# Patient Record
Sex: Female | Born: 1965 | Race: Black or African American | Hispanic: No | Smoking: Current some day smoker
Health system: Southern US, Community
[De-identification: ages and names within clinical notes are randomized; demographics above are authoritative.]

## PROBLEM LIST (undated history)

## (undated) DIAGNOSIS — Z8619 Personal history of other infectious and parasitic diseases: Secondary | ICD-10-CM

## (undated) DIAGNOSIS — Z8719 Personal history of other diseases of the digestive system: Secondary | ICD-10-CM

## (undated) DIAGNOSIS — B1910 Unspecified viral hepatitis B without hepatic coma: Secondary | ICD-10-CM

## (undated) DIAGNOSIS — D573 Sickle-cell trait: Secondary | ICD-10-CM

## (undated) HISTORY — PX: TUBAL LIGATION: SHX77

## (undated) HISTORY — PX: INCISION AND DRAINAGE INTRA ORAL ABSCESS: SHX1802

## (undated) HISTORY — PX: WISDOM TOOTH EXTRACTION: SHX21

---

## 1998-01-06 ENCOUNTER — Emergency Department (HOSPITAL_COMMUNITY): Admission: EM | Admit: 1998-01-06 | Discharge: 1998-01-06 | Payer: Self-pay | Admitting: Emergency Medicine

## 1998-01-10 ENCOUNTER — Emergency Department (HOSPITAL_COMMUNITY): Admission: EM | Admit: 1998-01-10 | Discharge: 1998-01-10 | Payer: Self-pay | Admitting: Emergency Medicine

## 1999-09-20 ENCOUNTER — Emergency Department (HOSPITAL_COMMUNITY): Admission: EM | Admit: 1999-09-20 | Discharge: 1999-09-20 | Payer: Self-pay

## 1999-12-17 ENCOUNTER — Emergency Department (HOSPITAL_COMMUNITY): Admission: EM | Admit: 1999-12-17 | Discharge: 1999-12-17 | Payer: Self-pay | Admitting: *Deleted

## 2000-01-21 ENCOUNTER — Encounter: Payer: Self-pay | Admitting: Emergency Medicine

## 2000-01-21 ENCOUNTER — Emergency Department (HOSPITAL_COMMUNITY): Admission: EM | Admit: 2000-01-21 | Discharge: 2000-01-21 | Payer: Self-pay | Admitting: Emergency Medicine

## 2001-03-21 ENCOUNTER — Emergency Department (HOSPITAL_COMMUNITY): Admission: EM | Admit: 2001-03-21 | Discharge: 2001-03-22 | Payer: Self-pay | Admitting: Emergency Medicine

## 2001-03-21 ENCOUNTER — Encounter: Payer: Self-pay | Admitting: Emergency Medicine

## 2001-10-12 ENCOUNTER — Emergency Department (HOSPITAL_COMMUNITY): Admission: EM | Admit: 2001-10-12 | Discharge: 2001-10-12 | Payer: Self-pay | Admitting: Emergency Medicine

## 2001-10-14 ENCOUNTER — Emergency Department (HOSPITAL_COMMUNITY): Admission: EM | Admit: 2001-10-14 | Discharge: 2001-10-14 | Payer: Self-pay | Admitting: Emergency Medicine

## 2002-03-26 ENCOUNTER — Inpatient Hospital Stay (HOSPITAL_COMMUNITY): Admission: EM | Admit: 2002-03-26 | Discharge: 2002-03-29 | Payer: Self-pay

## 2002-11-03 ENCOUNTER — Emergency Department (HOSPITAL_COMMUNITY): Admission: EM | Admit: 2002-11-03 | Discharge: 2002-11-03 | Payer: Self-pay | Admitting: Emergency Medicine

## 2003-02-23 ENCOUNTER — Ambulatory Visit (HOSPITAL_COMMUNITY): Admission: EM | Admit: 2003-02-23 | Discharge: 2003-02-23 | Payer: Self-pay | Admitting: Emergency Medicine

## 2003-06-12 ENCOUNTER — Emergency Department (HOSPITAL_COMMUNITY): Admission: EM | Admit: 2003-06-12 | Discharge: 2003-06-12 | Payer: Self-pay | Admitting: Emergency Medicine

## 2003-09-27 ENCOUNTER — Emergency Department (HOSPITAL_COMMUNITY): Admission: EM | Admit: 2003-09-27 | Discharge: 2003-09-27 | Payer: Self-pay | Admitting: Emergency Medicine

## 2003-10-13 ENCOUNTER — Emergency Department (HOSPITAL_COMMUNITY): Admission: EM | Admit: 2003-10-13 | Discharge: 2003-10-13 | Payer: Self-pay | Admitting: Emergency Medicine

## 2004-03-16 ENCOUNTER — Emergency Department (HOSPITAL_COMMUNITY): Admission: EM | Admit: 2004-03-16 | Discharge: 2004-03-16 | Payer: Self-pay | Admitting: Emergency Medicine

## 2004-06-21 ENCOUNTER — Emergency Department (HOSPITAL_COMMUNITY): Admission: EM | Admit: 2004-06-21 | Discharge: 2004-06-21 | Payer: Self-pay | Admitting: Emergency Medicine

## 2005-02-10 ENCOUNTER — Emergency Department (HOSPITAL_COMMUNITY): Admission: EM | Admit: 2005-02-10 | Discharge: 2005-02-10 | Payer: Self-pay | Admitting: Emergency Medicine

## 2005-10-22 ENCOUNTER — Emergency Department (HOSPITAL_COMMUNITY): Admission: EM | Admit: 2005-10-22 | Discharge: 2005-10-22 | Payer: Self-pay | Admitting: Emergency Medicine

## 2006-02-06 ENCOUNTER — Emergency Department (HOSPITAL_COMMUNITY): Admission: EM | Admit: 2006-02-06 | Discharge: 2006-02-06 | Payer: Self-pay | Admitting: Emergency Medicine

## 2006-12-08 ENCOUNTER — Emergency Department (HOSPITAL_COMMUNITY): Admission: EM | Admit: 2006-12-08 | Discharge: 2006-12-08 | Payer: Self-pay | Admitting: Emergency Medicine

## 2006-12-21 ENCOUNTER — Inpatient Hospital Stay (HOSPITAL_COMMUNITY): Admission: EM | Admit: 2006-12-21 | Discharge: 2007-01-19 | Payer: Self-pay | Admitting: Emergency Medicine

## 2006-12-21 ENCOUNTER — Ambulatory Visit: Payer: Self-pay | Admitting: Internal Medicine

## 2006-12-23 ENCOUNTER — Ambulatory Visit: Payer: Self-pay | Admitting: Infectious Diseases

## 2006-12-23 ENCOUNTER — Ambulatory Visit: Payer: Self-pay | Admitting: Internal Medicine

## 2006-12-28 ENCOUNTER — Encounter (INDEPENDENT_AMBULATORY_CARE_PROVIDER_SITE_OTHER): Payer: Self-pay | Admitting: Cardiology

## 2007-01-13 ENCOUNTER — Ambulatory Visit: Payer: Self-pay | Admitting: Vascular Surgery

## 2007-01-13 ENCOUNTER — Ambulatory Visit: Payer: Self-pay | Admitting: Physical Medicine & Rehabilitation

## 2007-01-13 ENCOUNTER — Encounter: Payer: Self-pay | Admitting: Vascular Surgery

## 2007-01-19 ENCOUNTER — Inpatient Hospital Stay (HOSPITAL_COMMUNITY)
Admission: RE | Admit: 2007-01-19 | Discharge: 2007-01-25 | Payer: Self-pay | Admitting: Physical Medicine & Rehabilitation

## 2007-01-20 ENCOUNTER — Encounter: Payer: Self-pay | Admitting: Vascular Surgery

## 2007-01-27 ENCOUNTER — Encounter
Admission: RE | Admit: 2007-01-27 | Discharge: 2007-02-02 | Payer: Self-pay | Admitting: Physical Medicine & Rehabilitation

## 2007-01-29 ENCOUNTER — Encounter (INDEPENDENT_AMBULATORY_CARE_PROVIDER_SITE_OTHER): Payer: Self-pay | Admitting: Infectious Diseases

## 2007-01-29 ENCOUNTER — Ambulatory Visit: Payer: Self-pay | Admitting: *Deleted

## 2007-01-29 LAB — CONVERTED CEMR LAB
BUN: 3 mg/dL — ABNORMAL LOW (ref 6–23)
Calcium: 8.9 mg/dL (ref 8.4–10.5)
Creatinine, Ser: 0.48 mg/dL (ref 0.40–1.20)
Potassium: 4 meq/L (ref 3.5–5.3)

## 2007-02-01 ENCOUNTER — Encounter (INDEPENDENT_AMBULATORY_CARE_PROVIDER_SITE_OTHER): Payer: Self-pay | Admitting: Internal Medicine

## 2007-02-01 ENCOUNTER — Ambulatory Visit: Payer: Self-pay | Admitting: *Deleted

## 2007-02-01 DIAGNOSIS — K7200 Acute and subacute hepatic failure without coma: Secondary | ICD-10-CM | POA: Insufficient documentation

## 2007-02-01 DIAGNOSIS — I890 Lymphedema, not elsewhere classified: Secondary | ICD-10-CM | POA: Insufficient documentation

## 2007-02-01 DIAGNOSIS — B1910 Unspecified viral hepatitis B without hepatic coma: Secondary | ICD-10-CM | POA: Insufficient documentation

## 2007-02-01 DIAGNOSIS — H11159 Pinguecula, unspecified eye: Secondary | ICD-10-CM

## 2007-02-01 DIAGNOSIS — Z8619 Personal history of other infectious and parasitic diseases: Secondary | ICD-10-CM

## 2007-02-01 DIAGNOSIS — N19 Unspecified kidney failure: Secondary | ICD-10-CM | POA: Insufficient documentation

## 2007-02-01 LAB — CONVERTED CEMR LAB
CO2: 26 meq/L (ref 19–32)
Chloride: 105 meq/L (ref 96–112)
Glucose, Bld: 103 mg/dL — ABNORMAL HIGH (ref 70–99)
Potassium: 4.3 meq/L (ref 3.5–5.3)
Sodium: 140 meq/L (ref 135–145)

## 2007-03-01 LAB — CONVERTED CEMR LAB
AST: 66 units/L — ABNORMAL HIGH (ref 0–37)
Bilirubin, Direct: 2.7 mg/dL — ABNORMAL HIGH (ref 0.0–0.3)
Total Bilirubin: 5.3 mg/dL — ABNORMAL HIGH (ref 0.3–1.2)

## 2007-03-18 ENCOUNTER — Ambulatory Visit: Payer: Self-pay | Admitting: Infectious Diseases

## 2007-03-18 LAB — CONVERTED CEMR LAB
ALT: 34 units/L (ref 0–35)
Alkaline Phosphatase: 77 units/L (ref 39–117)
Basophils Absolute: 0 10*3/uL (ref 0.0–0.1)
Creatinine, Ser: 0.74 mg/dL (ref 0.40–1.20)
Eosinophils Absolute: 0.1 10*3/uL (ref 0.0–0.7)
Eosinophils Relative: 1 % (ref 0–5)
Glucose, Bld: 72 mg/dL (ref 70–99)
HCT: 40.9 % (ref 36.0–46.0)
Hep B S Ab: POSITIVE — AB
MCHC: 34.2 g/dL (ref 30.0–36.0)
MCV: 85.4 fL (ref 78.0–100.0)
Platelets: 309 10*3/uL (ref 150–400)
RDW: 13.9 % (ref 11.5–14.0)
Sodium: 139 meq/L (ref 135–145)
Total Bilirubin: 0.7 mg/dL (ref 0.3–1.2)
Total Protein: 7.8 g/dL (ref 6.0–8.3)

## 2007-05-13 ENCOUNTER — Ambulatory Visit: Payer: Self-pay | Admitting: Internal Medicine

## 2007-05-13 ENCOUNTER — Encounter (INDEPENDENT_AMBULATORY_CARE_PROVIDER_SITE_OTHER): Payer: Self-pay | Admitting: Internal Medicine

## 2007-05-13 DIAGNOSIS — E876 Hypokalemia: Secondary | ICD-10-CM | POA: Insufficient documentation

## 2007-05-13 DIAGNOSIS — R1011 Right upper quadrant pain: Secondary | ICD-10-CM | POA: Insufficient documentation

## 2007-05-14 LAB — CONVERTED CEMR LAB
BUN: 14 mg/dL (ref 6–23)
Chloride: 107 meq/L (ref 96–112)
Potassium: 4.3 meq/L (ref 3.5–5.3)
Sodium: 138 meq/L (ref 135–145)

## 2007-05-19 ENCOUNTER — Telehealth (INDEPENDENT_AMBULATORY_CARE_PROVIDER_SITE_OTHER): Payer: Self-pay | Admitting: Internal Medicine

## 2007-06-21 ENCOUNTER — Telehealth (INDEPENDENT_AMBULATORY_CARE_PROVIDER_SITE_OTHER): Payer: Self-pay | Admitting: Internal Medicine

## 2007-07-05 ENCOUNTER — Encounter (INDEPENDENT_AMBULATORY_CARE_PROVIDER_SITE_OTHER): Payer: Self-pay | Admitting: *Deleted

## 2007-07-05 ENCOUNTER — Ambulatory Visit: Payer: Self-pay | Admitting: Infectious Disease

## 2007-07-05 LAB — CONVERTED CEMR LAB
ALT: 29 U/L
AST: 26 U/L
Albumin: 4.6 g/dL
Alkaline Phosphatase: 52 U/L
BUN: 14 mg/dL
Basophils Absolute: 0 10*3/uL
Basophils Relative: 0 %
CO2: 20 meq/L
Calcium: 9.6 mg/dL
Chloride: 107 meq/L
Creatinine, Ser: 0.78 mg/dL
Eosinophils Absolute: 0.1 10*3/uL
Eosinophils Relative: 1 %
Glucose, Bld: 81 mg/dL
HCT: 36.9 %
HCV Ab: NEGATIVE
HEP B PCR: <100 (ref <100)
Hemoglobin: 13 g/dL
Hep B E Ag: NEGATIVE
Hepatitis B Surface Ag: NEGATIVE
INR: 0.9 (ref 0.0–1.5)
Lymphocytes Relative: 58 % — ABNORMAL HIGH
Lymphs Abs: 5 10*3/uL — ABNORMAL HIGH
MCHC: 35.2 g/dL
MCV: 85.8 fL
Monocytes Absolute: 0.5 10*3/uL
Monocytes Relative: 5 %
Neutro Abs: 3.1 10*3/uL
Neutrophils Relative %: 35 % — ABNORMAL LOW
Platelets: 349 10*3/uL
Potassium: 4.3 meq/L
RBC: 4.3 M/uL
RDW: 13.1 %
Sodium: 139 meq/L
Total Bilirubin: 0.3 mg/dL
Total Protein: 7.8 g/dL
WBC: 8.7 10*3/uL

## 2007-07-27 ENCOUNTER — Encounter: Payer: Self-pay | Admitting: Infectious Disease

## 2007-09-07 ENCOUNTER — Ambulatory Visit: Payer: Self-pay | Admitting: Infectious Disease

## 2007-09-07 LAB — CONVERTED CEMR LAB
ALT: 24 units/L (ref 0–35)
AST: 20 units/L (ref 0–37)
Albumin: 4.3 g/dL (ref 3.5–5.2)
Alkaline Phosphatase: 56 units/L (ref 39–117)
BUN: 11 mg/dL (ref 6–23)
Basophils Absolute: 0 10*3/uL (ref 0.0–0.1)
Basophils Relative: 0 % (ref 0–1)
Calcium: 8.9 mg/dL (ref 8.4–10.5)
Chloride: 106 meq/L (ref 96–112)
Creatinine, Ser: 0.75 mg/dL (ref 0.40–1.20)
Eosinophils Absolute: 0.2 10*3/uL (ref 0.2–0.7)
Hep B S Ab: POSITIVE — AB
Hepatitis B Surface Ag: NEGATIVE
MCHC: 34.9 g/dL (ref 30.0–36.0)
MCV: 84.6 fL (ref 78.0–100.0)
Monocytes Relative: 7 % (ref 3–12)
Neutro Abs: 4.3 10*3/uL (ref 1.7–7.7)
Neutrophils Relative %: 41 % — ABNORMAL LOW (ref 43–77)
Potassium: 4.2 meq/L (ref 3.5–5.3)
RBC: 4.34 M/uL (ref 3.87–5.11)
RDW: 12.9 % (ref 11.5–15.5)
Total Protein: 7.1 g/dL (ref 6.0–8.3)

## 2007-12-30 IMAGING — CR DG CHEST 1V PORT
1 series · 1 of 1 positions shown · non-contrast
Comparison: 01/07/07.

CLINICAL DATA: Fever.  Leukocytosis.  
 PORTABLE CHEST ([DATE] HOURS):

[AP]
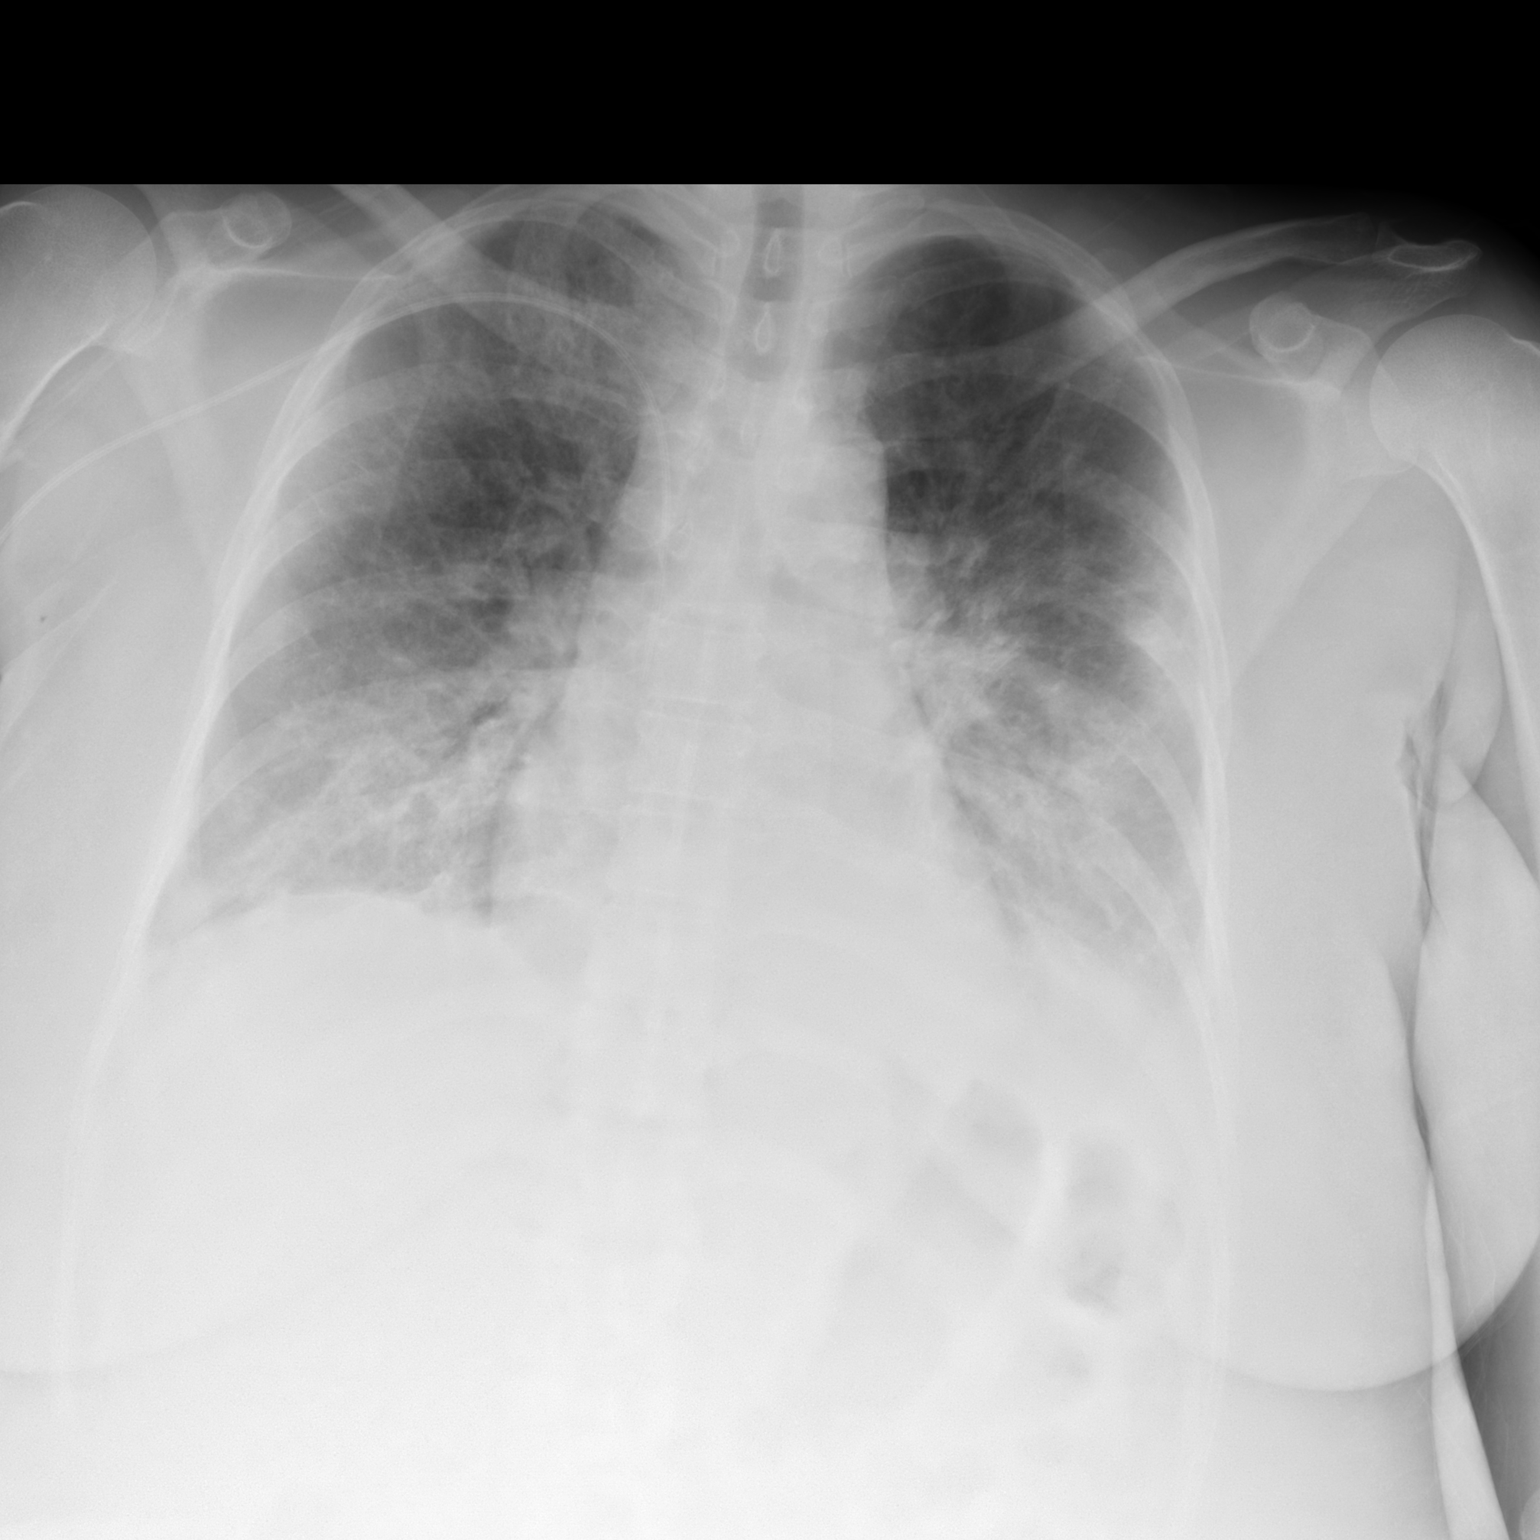

[1 of 1 positions shown; findings below may reference images not displayed]

FINDINGS: The lungs are not as well aerated and there is more opacity bilaterally.  Considerations are that of pulmonary vascular congestion and interstitial edema versus possible pneumonia.  No definite effusion is seen.  Heart size is stable.  Endotracheal tube is no longer seen.  Right central venous line remains.
IMPRESSION: Patchy lung opacities bilaterally.  Possible interstitial edema but cannot exclude pneumonia such as a viral pneumonia.  Recommend followup two view chest x-ray.

## 2008-02-29 ENCOUNTER — Encounter (INDEPENDENT_AMBULATORY_CARE_PROVIDER_SITE_OTHER): Payer: Self-pay | Admitting: *Deleted

## 2008-02-29 ENCOUNTER — Ambulatory Visit: Payer: Self-pay | Admitting: *Deleted

## 2008-03-01 LAB — CONVERTED CEMR LAB
BUN: 11 mg/dL (ref 6–23)
CO2: 19 meq/L (ref 19–32)
Calcium: 10.1 mg/dL (ref 8.4–10.5)
Chlamydia, Swab/Urine, PCR: NEGATIVE
Chloride: 108 meq/L (ref 96–112)
Creatinine, Ser: 0.74 mg/dL (ref 0.40–1.20)
Glucose, Bld: 95 mg/dL (ref 70–99)

## 2008-04-28 ENCOUNTER — Ambulatory Visit: Payer: Self-pay | Admitting: Internal Medicine

## 2008-04-28 ENCOUNTER — Encounter (INDEPENDENT_AMBULATORY_CARE_PROVIDER_SITE_OTHER): Payer: Self-pay | Admitting: Internal Medicine

## 2008-04-28 LAB — CONVERTED CEMR LAB
AFP-Tumor Marker: 3.1 ng/mL (ref 0.0–8.0)
ALT: 27 units/L (ref 0–35)
Albumin: 3.9 g/dL (ref 3.5–5.2)
Alkaline Phosphatase: 49 units/L (ref 39–117)
Bilirubin Urine: NEGATIVE
CO2: 24 meq/L (ref 19–32)
Cholesterol: 236 mg/dL — ABNORMAL HIGH (ref 0–200)
Eosinophils Relative: 2 % (ref 0–5)
Glucose, Bld: 82 mg/dL (ref 70–99)
HCT: 39.3 % (ref 36.0–46.0)
Hemoglobin: 13.3 g/dL (ref 12.0–15.0)
LDL Cholesterol: 135 mg/dL — ABNORMAL HIGH (ref 0–99)
Lymphocytes Relative: 37 % (ref 12–46)
Lymphs Abs: 3.6 10*3/uL (ref 0.7–4.0)
Monocytes Absolute: 0.5 10*3/uL (ref 0.1–1.0)
Neutro Abs: 5.3 10*3/uL (ref 1.7–7.7)
Potassium: 4 meq/L (ref 3.5–5.3)
Protein, ur: NEGATIVE mg/dL
RBC: 4.44 M/uL (ref 3.87–5.11)
Sodium: 134 meq/L — ABNORMAL LOW (ref 135–145)
Specific Gravity, Urine: 1.015 (ref 1.005–1.03)
Total Protein: 6.7 g/dL (ref 6.0–8.3)
Triglycerides: 152 mg/dL — ABNORMAL HIGH (ref <150)
Urobilinogen, UA: 0.2 (ref 0.0–1.0)
VLDL: 30 mg/dL (ref 0–40)
WBC: 9.6 10*3/uL (ref 4.0–10.5)
aPTT: 29 s (ref 24–37)

## 2008-05-05 ENCOUNTER — Ambulatory Visit (HOSPITAL_COMMUNITY): Admission: RE | Admit: 2008-05-05 | Discharge: 2008-05-05 | Payer: Self-pay | Admitting: Internal Medicine

## 2008-06-06 ENCOUNTER — Ambulatory Visit: Payer: Self-pay | Admitting: Internal Medicine

## 2008-06-24 ENCOUNTER — Emergency Department (HOSPITAL_COMMUNITY): Admission: EM | Admit: 2008-06-24 | Discharge: 2008-06-24 | Payer: Self-pay | Admitting: Emergency Medicine

## 2008-09-07 ENCOUNTER — Ambulatory Visit: Payer: Self-pay | Admitting: Infectious Disease

## 2008-09-07 LAB — CONVERTED CEMR LAB
Alkaline Phosphatase: 51 units/L (ref 39–117)
BUN: 6 mg/dL (ref 6–23)
CO2: 21 meq/L (ref 19–32)
Eosinophils Absolute: 0.1 10*3/uL (ref 0.0–0.7)
Eosinophils Relative: 1 % (ref 0–5)
Glucose, Bld: 90 mg/dL (ref 70–99)
HCT: 37.6 % (ref 36.0–46.0)
Hemoglobin: 13.3 g/dL (ref 12.0–15.0)
Hep A IgM: NEGATIVE
Hep B C IgM: POSITIVE — AB
Hepatitis B Surface Ag: NEGATIVE
Lymphocytes Relative: 39 % (ref 12–46)
Lymphs Abs: 4.2 10*3/uL — ABNORMAL HIGH (ref 0.7–4.0)
MCV: 84.5 fL (ref 78.0–100.0)
Monocytes Absolute: 0.6 10*3/uL (ref 0.1–1.0)
Monocytes Relative: 5 % (ref 3–12)
RBC: 4.45 M/uL (ref 3.87–5.11)
Sodium: 139 meq/L (ref 135–145)
Total Bilirubin: 0.4 mg/dL (ref 0.3–1.2)
Total Protein: 7.7 g/dL (ref 6.0–8.3)
WBC: 10.9 10*3/uL — ABNORMAL HIGH (ref 4.0–10.5)
aPTT: 28 s (ref 24–37)

## 2008-09-15 ENCOUNTER — Telehealth: Payer: Self-pay | Admitting: Infectious Disease

## 2008-11-30 ENCOUNTER — Emergency Department (HOSPITAL_COMMUNITY): Admission: EM | Admit: 2008-11-30 | Discharge: 2008-11-30 | Payer: Self-pay | Admitting: Emergency Medicine

## 2009-05-09 ENCOUNTER — Emergency Department (HOSPITAL_COMMUNITY): Admission: EM | Admit: 2009-05-09 | Discharge: 2009-05-09 | Payer: Self-pay | Admitting: Emergency Medicine

## 2009-07-24 ENCOUNTER — Emergency Department (HOSPITAL_COMMUNITY): Admission: EM | Admit: 2009-07-24 | Discharge: 2009-07-24 | Payer: Self-pay | Admitting: Emergency Medicine

## 2009-09-27 ENCOUNTER — Emergency Department (HOSPITAL_COMMUNITY): Admission: EM | Admit: 2009-09-27 | Discharge: 2009-09-27 | Payer: Self-pay | Admitting: Emergency Medicine

## 2010-01-09 ENCOUNTER — Observation Stay (HOSPITAL_COMMUNITY): Admission: EM | Admit: 2010-01-09 | Discharge: 2010-01-11 | Payer: Self-pay | Admitting: Emergency Medicine

## 2010-01-17 ENCOUNTER — Emergency Department (HOSPITAL_COMMUNITY): Admission: EM | Admit: 2010-01-17 | Discharge: 2010-01-17 | Payer: Self-pay | Admitting: Emergency Medicine

## 2010-10-11 ENCOUNTER — Emergency Department (HOSPITAL_COMMUNITY)
Admission: EM | Admit: 2010-10-11 | Discharge: 2010-10-11 | Payer: Self-pay | Source: Home / Self Care | Admitting: Emergency Medicine

## 2010-10-27 ENCOUNTER — Emergency Department (HOSPITAL_COMMUNITY)
Admission: EM | Admit: 2010-10-27 | Discharge: 2010-10-27 | Payer: Self-pay | Source: Home / Self Care | Admitting: Emergency Medicine

## 2010-12-18 LAB — BASIC METABOLIC PANEL
BUN: 10 mg/dL (ref 6–23)
BUN: 9 mg/dL (ref 6–23)
CO2: 22 mEq/L (ref 19–32)
Calcium: 9.3 mg/dL (ref 8.4–10.5)
Creatinine, Ser: 0.59 mg/dL (ref 0.4–1.2)
GFR calc non Af Amer: >60 mL/min (ref >60)
Glucose, Bld: 130 mg/dL — ABNORMAL HIGH (ref 70–99)
Glucose, Bld: 151 mg/dL — ABNORMAL HIGH (ref 70–99)
Potassium: 3.5 mEq/L (ref 3.5–5.1)
Potassium: 3.9 mEq/L (ref 3.5–5.1)
Sodium: 135 mEq/L (ref 135–145)

## 2010-12-18 LAB — URINALYSIS, ROUTINE W REFLEX MICROSCOPIC
Bilirubin Urine: NEGATIVE
Hgb urine dipstick: NEGATIVE
Ketones, ur: NEGATIVE mg/dL
Specific Gravity, Urine: 1.015 (ref 1.005–1.030)
Urobilinogen, UA: 1 mg/dL (ref 0.0–1.0)
pH: 7 (ref 5.0–8.0)

## 2010-12-18 LAB — DIFFERENTIAL
Basophils Relative: 0 % (ref 0–1)
Eosinophils Absolute: 0 10*3/uL (ref 0.0–0.7)
Eosinophils Relative: 0 % (ref 0–5)
Monocytes Absolute: 0.1 10*3/uL (ref 0.1–1.0)
Monocytes Relative: 1 % — ABNORMAL LOW (ref 3–12)

## 2010-12-18 LAB — CBC
Hemoglobin: 13 g/dL (ref 12.0–15.0)
MCHC: 34.6 g/dL (ref 30.0–36.0)
MCV: 87.6 fL (ref 78.0–100.0)
RBC: 4.29 MIL/uL (ref 3.87–5.11)
RDW: 13.4 % (ref 11.5–15.5)

## 2011-01-04 LAB — WET PREP, GENITAL
WBC, Wet Prep HPF POC: NONE SEEN
Yeast Wet Prep HPF POC: NONE SEEN

## 2011-02-14 NOTE — Discharge Summary (Signed)
[facility]  Patient:    [name], [name] Visit Number: [id number] MRN: [id number]          Service Type: MED Location: [phone] Attending Physician:  Judie Petit Dictated by:   Lorelle Formosa, M.D. Admit Date:  03/26/2002 Discharge Date: 03/29/2002                             Discharge Summary  ADMISSION DIAGNOSIS:  Acute pyelonephritis.  DISCHARGE DIAGNOSIS:  Acute pyelonephritis.  DISCHARGE CONDITION:  Stable.  DISCHARGE DISPOSITION:  Follow up in office in approximately 1-2 weeks.  DISCHARGE MEDICATIONS: 1. Tequin 200 mg daily until gone. 2. Vicodin one q.i.d. p.r.n. pain.  HISTORY OF PRESENT ILLNESS:  The patient is a 45 year old woman who presented with a 1-week history of abdominal pain and dysuria.  She was complaining of lower back pain with nausea and no vomiting.  She denied vaginal discharge. She had history of frequent urinary tract infections in the past.  She states she had a temperature of 103 at home and over-the-counter medicines were not helping thus she presented to emergency room and was admitted by on-call physician Dr. Alwyn Pea.  PAST MEDICAL HISTORY/FAMILY HISTORY/PERSONAL HISTORY/REVIEW OF SYSTEMS ARE OUTLINED IN ADMISSION HISTORY AND PHYSICAL.  PHYSICAL EXAMINATION:  VITAL SIGNS:  Blood pressure 116/71, pulse 106, respirations 18, temperature 98.4.  The patient had mild distress.  HEENT:  PERRLA.  EOMs were intact.  Mucous membranes were moist.  NECK:  Supple with no jugular venous distention, bruits, nodes, or thyromegaly.  CHEST:  Clear to auscultation.  HEART:  Regular rhythm and rate with no murmur.  ABDOMEN:  Soft and flat.  There was some guarding to palpation of the flanks apparently.  EXTREMITIES:  She was ambulatory without difficulty.  NEUROLOGIC:  Neurologic examination was grossly physiologic.  LABORATORIES:  CBC revealed white count of 16,700 with a hemoglobin of  12.5, hematocrit 35.8, platelets 339,000, 84 neutrophils, 9 lymphocytes, 6 monocytes, 1 eosinophil, _______ was unremarkable.  Repeat CBC revealed white count of 9.4 prior to discharge with platelets 306,000.  Initial chemistry revealed sodium 134 and glucose 111.  Urine was straw and cloudy with specific gravity of 1.008 and pH 6.5, was a moderate amount of hemoglobin present with positive nitrite and large leukocyte esterase with many bacteria.  Urine cultures grew E. coli sensitive to all antibiotics.  Blood cultures were negative x2.  HOSPITAL COURSE:  The patient was admitted to the hospital and given IV of D-5 1/2-normal saline with 20 mEq Kay Ciel per liter at 125 cc/hr.  She was given Tylenol and Vicodin for fever or pain and gradually improved.  The patient was thus discharged for outpatient management. Dictated by:   Lorelle Formosa, M.D. Attending Physician:  Judie Petit DD:  04/06/02 TD:  04/09/02 Job: [id number]

## 2011-02-14 NOTE — H&P (Signed)
[name], [name]                         ACCOUNT NO.:  [id number]   MEDICAL RECORD NO.:  [id number]                   PATIENT TYPE:  EMS   LOCATION:  ED                                   FACILITY:  [facility]   PHYSICIAN:  Sandria Bales. Ezzard Standing, M.D.               DATE OF BIRTH:  01-28-1966   DATE OF ADMISSION:  02/23/2003  DATE OF DISCHARGE:                                HISTORY & PHYSICAL   HISTORY OF PRESENT ILLNESS:  This is a 44 year old black female who is a  patient of Dr. Adela Lank Kelly who at least for three or four months have  been having perirectal pain that has been attributed to a fissure.  Apparently, it gets better after a month and then gets worse.  She has been  on different medications for it.  Then over the last three to four days she  has had worsening rectal pain.  She called Dr. Dimas Kelly office today and  he told her to go to the emergency room.   She denies any history of peptic ulcer disease, liver disease, pancreatic  disease, or change of bowel habit.  She has never had a history of Crohn's  disease or colitis.  She has never had any endoscopic examination.   ALLERGIES:  No known drug allergies.   MEDICATIONS:  1. Aspirin.  2. Vitamins.   PHYSICAL EXAMINATION:  NEUROLOGIC:  No seizures or loss of consciousness.  PULMONARY:  Smokes cigarettes, about a pack a day, knows this is bad for her  health.  No history of pneumonia or tuberculosis.  CARDIAC:  No chest pain  or hypertension.  GASTROINTESTINAL:  See history of present illness.  UROLOGIC:  She was hospitalized about eight months ago for a kidney  infection, but she has had no more kidney trouble.  GYNECOLOGIC:  She has  two sons, a 54 year old and a 53-year-old.  She is on her period now with  having regular periods.   SOCIAL HISTORY:  She works as a Scientist, research (medical), and when I examine her she is  by herself.   PHYSICAL EXAMINATION:  VITAL SIGNS:  Temperature 98.7, blood pressure  139/88, pulse  73, respirations 16.  GENERAL:  She is a well-nourished pleasant black female, and clearly  uncomfortable.  NECK:  Supple without mass or thyromegaly.  LUNGS:  Clear to auscultation.  HEART:  Regular rate and rhythm without murmur or rub.  ABDOMEN:  Soft, I feel no mass or tenderness.  RECTAL:  On the right posterior rectum she has about a 3 to 4 cm ballotable  area consistent with a perirectal abscess.  There is no way I could do a  rectal examination on her.  She is way too tender for me to try to incise  and drain this in the emergency room.  I think this would best be done under  general anesthesia in the operating room.  IMPRESSION:  Right posterior perirectal abscess.    PLAN:  I discussed with the patient about proceeding incising and drainage,  and depending on how well she does is whether she will spend the night in  the hospital or go home today.                                               Sandria Bales. Ezzard Standing, M.D.    DHN/MEDQ  D:  02/23/2003  T:  02/23/2003  Job:  [id number]   cc:   Lorelle Formosa, M.D.  [phone] [address]  [place]  [place] [address]  Fax: [phone]
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
## 2011-02-14 NOTE — Procedures (Signed)
Mrs.  Kelly is a 45 year old female patient currently in room 2107.   RECORDING DATE:  12/28/2006.   Physician Rory Percy ordered the study as a portable EEG on a  comatose patient.  Current activating procedures were not performed.   The patient is currently on Protonix, insulin, NovoLog, Reglan, Lantus,  IV fluids with sodium chloride,  Lopressor and Zosyn.  The patient is on  tube feeding.   HISTORY:  A 45 year old female with sickle cell trait found at home  confused.  The patient is now severely impaired and lethargic.  Additional past medical history was given as the patient being infected  with hepatitis B and having suffered a hepatic encephalopathy in the  past.   Six channel EEG recording with one channel representing heart rate and  rhythm exclusively was performed without any provocation maneuvers.  There is no posterior dominant background identifiable in this  recording.  The patient shows generalized slow wave delta coma  frequencies range between two and 4 Hz.  The EKG is a normal sinus  rhythm with an average of 62 beats per minute.  I was unable to find any  documented attempts at stimulation of the patient during this recording.  There was however movement of the head noticed and occasional swallowing  movements which caused a specific artifact.   CONCLUSION:  This is an abnormal EEG showing a potentially comatose  patient in delta with delta range frequency EEG.  No epileptiform  discharges were noticed.      Melvyn Novas, M.D.  Electronically Signed     OI:ZTIW  D:  12/29/2006 14:06:05  T:  12/29/2006 16:14:19  Job #:  [id number]   cc:   Nelda Bucks, MD  [address] [place] [address]
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
## 2011-02-14 NOTE — Op Note (Signed)
[name], [name]                         ACCOUNT NO.:  [id number]   MEDICAL RECORD NO.:  [id number]                   PATIENT TYPE:  EMS   LOCATION:  ED                                   FACILITY[facility]  [facility]   PHYSICIAN:  Sandria Bales. Ezzard Standing, M.D.               DATE OF BIRTH:  1966/05/07   DATE OF PROCEDURE:  02/23/2003  DATE OF DISCHARGE:                                 OPERATIVE REPORT   PREOPERATIVE DIAGNOSES:  Right posterior perirectal abscess.   POSTOPERATIVE DIAGNOSES:  Right posterior perirectal abscess, approximately  40-50 mL of purulent material.   PROCEDURE:  Incision and drainage of perirectal abscess with packing.   SURGEON:  Sandria Bales. Ezzard Standing, M.D.   ANESTHESIA:  General endotracheal.   ESTIMATED BLOOD LOSS:  Minimal.   INDICATIONS FOR PROCEDURE:  [name] is a 46 year old white female whose  been having some perirectal discomfort which may be a fissure that she has  had for 3 or 4 months. Over the last 3 or 4 days, she has had acute  worsening of her rectal pain. In the emergency room, she really could not  tolerate hardly a physical examination but clearly has an inflamed area in  the right posterior rectum consistent with a right perirectal abscess. I  discussed with her about taking her to the operating room, discussed the  indications, potential complications. The potential complications include  but not limited to bleeding, infection, recurrence of the abscess, but I  think she is by far best served by having this incised and drained while she  is asleep.   DESCRIPTION OF PROCEDURE:  The patient was placed in lithotomy position  after general endotracheal anesthetic supervised by Dr. Frutoso Chase. She  was given 2 g of Mefoxin at the beginning of the procedure. Her rectum was  prepped with Betadine solution and sterilely draped. At the right posterior  rectum, she had an approximately 3-4 cm ballottable area, I made a cruciate  incision through this,  drained about 40-50 mL of pus. I did not culture it  because I think this is __________ abscess, so I will just treat  empirically. She was given 2 g of  Mefoxin at the beginning of the procedure. I packed it with gauze soaked in  Betadine. At the time I am dictating this it is yet to be determined whether  she will spend the night or go home. I want to see if there is family around  when she goes home and if not she will spend the night. Her sponge and  needle count were correct at the end of the case.                                                Sandria Bales.  Ezzard Standing, M.D.    DHN/MEDQ  D:  02/23/2003  T:  02/23/2003  Job:  [id number]   cc:   Lorelle Formosa, M.D.  [phone]  [place]  [place] [address]  Fax: [phone]

## 2011-03-03 ENCOUNTER — Emergency Department (HOSPITAL_COMMUNITY)
Admission: EM | Admit: 2011-03-03 | Discharge: 2011-03-03 | Disposition: A | Discharge status: Home / Self Care | Payer: Self-pay | Attending: Emergency Medicine | Admitting: Emergency Medicine

## 2011-03-03 DIAGNOSIS — K089 Disorder of teeth and supporting structures, unspecified: Secondary | ICD-10-CM | POA: Insufficient documentation

## 2011-03-03 DIAGNOSIS — R220 Localized swelling, mass and lump, head: Secondary | ICD-10-CM | POA: Insufficient documentation

## 2011-03-03 DIAGNOSIS — K029 Dental caries, unspecified: Secondary | ICD-10-CM | POA: Insufficient documentation

## 2011-03-03 DIAGNOSIS — Z8619 Personal history of other infectious and parasitic diseases: Secondary | ICD-10-CM | POA: Insufficient documentation

## 2011-03-16 ENCOUNTER — Emergency Department (HOSPITAL_COMMUNITY): Payer: Self-pay

## 2011-03-16 ENCOUNTER — Emergency Department (HOSPITAL_COMMUNITY)
Admission: EM | Admit: 2011-03-16 | Discharge: 2011-03-16 | Disposition: A | Discharge status: Home / Self Care | Payer: Self-pay | Attending: Emergency Medicine | Admitting: Emergency Medicine

## 2011-03-16 DIAGNOSIS — IMO0002 Reserved for concepts with insufficient information to code with codable children: Secondary | ICD-10-CM | POA: Insufficient documentation

## 2011-03-16 DIAGNOSIS — M545 Low back pain, unspecified: Secondary | ICD-10-CM | POA: Insufficient documentation

## 2011-03-16 DIAGNOSIS — Z8619 Personal history of other infectious and parasitic diseases: Secondary | ICD-10-CM | POA: Insufficient documentation

## 2011-05-03 ENCOUNTER — Emergency Department (HOSPITAL_COMMUNITY)
Admission: EM | Admit: 2011-05-03 | Discharge: 2011-05-03 | Disposition: A | Discharge status: Home / Self Care | Payer: Self-pay | Attending: Emergency Medicine | Admitting: Emergency Medicine

## 2011-05-03 DIAGNOSIS — K089 Disorder of teeth and supporting structures, unspecified: Secondary | ICD-10-CM | POA: Insufficient documentation

## 2011-05-03 DIAGNOSIS — Z8619 Personal history of other infectious and parasitic diseases: Secondary | ICD-10-CM | POA: Insufficient documentation

## 2011-05-15 ENCOUNTER — Emergency Department (HOSPITAL_COMMUNITY)
Admission: EM | Admit: 2011-05-15 | Discharge: 2011-05-15 | Disposition: A | Discharge status: Home / Self Care | Payer: Self-pay | Attending: Emergency Medicine | Admitting: Emergency Medicine

## 2011-05-15 DIAGNOSIS — Y92009 Unspecified place in unspecified non-institutional (private) residence as the place of occurrence of the external cause: Secondary | ICD-10-CM | POA: Insufficient documentation

## 2011-05-15 DIAGNOSIS — S99929A Unspecified injury of unspecified foot, initial encounter: Secondary | ICD-10-CM | POA: Insufficient documentation

## 2011-05-15 DIAGNOSIS — S91109A Unspecified open wound of unspecified toe(s) without damage to nail, initial encounter: Secondary | ICD-10-CM | POA: Insufficient documentation

## 2011-05-15 DIAGNOSIS — W2209XA Striking against other stationary object, initial encounter: Secondary | ICD-10-CM | POA: Insufficient documentation

## 2011-05-15 DIAGNOSIS — S8990XA Unspecified injury of unspecified lower leg, initial encounter: Secondary | ICD-10-CM | POA: Insufficient documentation

## 2011-05-15 DIAGNOSIS — Z8619 Personal history of other infectious and parasitic diseases: Secondary | ICD-10-CM | POA: Insufficient documentation

## 2011-06-19 ENCOUNTER — Emergency Department (HOSPITAL_COMMUNITY): Payer: Self-pay

## 2011-06-19 ENCOUNTER — Emergency Department (HOSPITAL_COMMUNITY)
Admission: EM | Admit: 2011-06-19 | Discharge: 2011-06-19 | Disposition: A | Discharge status: Home / Self Care | Payer: Self-pay | Attending: Emergency Medicine | Admitting: Emergency Medicine

## 2011-06-19 DIAGNOSIS — N83209 Unspecified ovarian cyst, unspecified side: Secondary | ICD-10-CM | POA: Insufficient documentation

## 2011-06-19 DIAGNOSIS — N898 Other specified noninflammatory disorders of vagina: Secondary | ICD-10-CM | POA: Insufficient documentation

## 2011-06-19 DIAGNOSIS — N739 Female pelvic inflammatory disease, unspecified: Secondary | ICD-10-CM | POA: Insufficient documentation

## 2011-06-19 DIAGNOSIS — R112 Nausea with vomiting, unspecified: Secondary | ICD-10-CM | POA: Insufficient documentation

## 2011-06-19 DIAGNOSIS — Z8619 Personal history of other infectious and parasitic diseases: Secondary | ICD-10-CM | POA: Insufficient documentation

## 2011-06-19 DIAGNOSIS — N949 Unspecified condition associated with female genital organs and menstrual cycle: Secondary | ICD-10-CM | POA: Insufficient documentation

## 2011-06-19 DIAGNOSIS — R109 Unspecified abdominal pain: Secondary | ICD-10-CM | POA: Insufficient documentation

## 2011-06-19 LAB — DIFFERENTIAL
Basophils Relative: 0 % (ref 0–1)
Eosinophils Absolute: 0.3 10*3/uL (ref 0.0–0.7)
Eosinophils Relative: 3 % (ref 0–5)
Lymphocytes Relative: 59 % — ABNORMAL HIGH (ref 12–46)
Monocytes Relative: 7 % (ref 3–12)
Neutro Abs: 3.3 10*3/uL (ref 1.7–7.7)
Neutrophils Relative %: 31 % — ABNORMAL LOW (ref 43–77)

## 2011-06-19 LAB — COMPREHENSIVE METABOLIC PANEL
AST: 19 U/L (ref 0–37)
Albumin: 4.1 g/dL (ref 3.5–5.2)
Alkaline Phosphatase: 53 U/L (ref 39–117)
BUN: 9 mg/dL (ref 6–23)
CO2: 20 mEq/L (ref 19–32)
Chloride: 103 mEq/L (ref 96–112)
Potassium: 3.5 mEq/L (ref 3.5–5.1)
Total Bilirubin: 0.2 mg/dL — ABNORMAL LOW (ref 0.3–1.2)

## 2011-06-19 LAB — CBC
HCT: 39.4 % (ref 36.0–46.0)
Hemoglobin: 14 g/dL (ref 12.0–15.0)
MCV: 83.7 fL (ref 78.0–100.0)
Platelets: 323 10*3/uL (ref 150–400)
RBC: 4.71 MIL/uL (ref 3.87–5.11)
WBC: 10.7 10*3/uL — ABNORMAL HIGH (ref 4.0–10.5)

## 2011-06-19 LAB — LIPASE, BLOOD: Lipase: 30 U/L (ref 11–59)

## 2011-06-19 LAB — URINALYSIS, ROUTINE W REFLEX MICROSCOPIC
Bilirubin Urine: NEGATIVE
Glucose, UA: NEGATIVE mg/dL
Nitrite: NEGATIVE
Specific Gravity, Urine: 1.01 (ref 1.005–1.030)
pH: 7.5 (ref 5.0–8.0)

## 2011-06-19 LAB — SEDIMENTATION RATE: Sed Rate: 14 mm/hr (ref 0–22)

## 2011-06-19 LAB — WET PREP, GENITAL: WBC, Wet Prep HPF POC: NONE SEEN

## 2012-06-13 ENCOUNTER — Encounter (HOSPITAL_COMMUNITY): Payer: Self-pay | Admitting: Physical Medicine and Rehabilitation

## 2012-06-13 ENCOUNTER — Emergency Department (HOSPITAL_COMMUNITY)
Admission: EM | Admit: 2012-06-13 | Discharge: 2012-06-13 | Disposition: A | Discharge status: Home / Self Care | Payer: Self-pay | Attending: Emergency Medicine | Admitting: Emergency Medicine

## 2012-06-13 ENCOUNTER — Emergency Department (HOSPITAL_COMMUNITY): Payer: Self-pay

## 2012-06-13 DIAGNOSIS — W108XXA Fall (on) (from) other stairs and steps, initial encounter: Secondary | ICD-10-CM | POA: Insufficient documentation

## 2012-06-13 DIAGNOSIS — W19XXXA Unspecified fall, initial encounter: Secondary | ICD-10-CM

## 2012-06-13 DIAGNOSIS — B1910 Unspecified viral hepatitis B without hepatic coma: Secondary | ICD-10-CM | POA: Insufficient documentation

## 2012-06-13 DIAGNOSIS — M549 Dorsalgia, unspecified: Secondary | ICD-10-CM | POA: Insufficient documentation

## 2012-06-13 DIAGNOSIS — Z888 Allergy status to other drugs, medicaments and biological substances status: Secondary | ICD-10-CM | POA: Insufficient documentation

## 2012-06-13 HISTORY — DX: Unspecified viral hepatitis B without hepatic coma: B19.10

## 2012-06-13 MED ORDER — OXYCODONE HCL 5 MG PO TABS: 5.0000 mg | ORAL_TABLET | ORAL | Status: AC | PRN

## 2012-06-13 MED ORDER — CYCLOBENZAPRINE HCL 5 MG PO TABS: 5.0000 mg | ORAL_TABLET | Freq: Three times a day (TID) | ORAL | Status: AC | PRN

## 2012-06-13 MED ORDER — IBUPROFEN 600 MG PO TABS: 600.0000 mg | ORAL_TABLET | Freq: Four times a day (QID) | ORAL | Status: AC | PRN

## 2012-06-13 MED ORDER — ONDANSETRON 4 MG PO TBDP
8.0000 mg | ORAL_TABLET | ORAL | Status: AC
  Administered 2012-06-13: 8 mg via ORAL
  Filled 2012-06-13: qty 2

## 2012-06-13 MED ORDER — HYDROMORPHONE HCL PF 1 MG/ML IJ SOLN
1.0000 mg | Freq: Once | INTRAMUSCULAR | Status: AC
  Administered 2012-06-13: 1 mg via INTRAMUSCULAR
  Filled 2012-06-13: qty 1

## 2012-06-13 NOTE — ED Provider Notes (Signed)
aHistory  This chart was scribed for Ethelda Chick, MD by Ladona Ridgel Day. This patient was seen in room TR08C/TR08C and the patient's care was started at 1300.   CSN: [id number]  Arrival date & time 06/13/12  1300   None     Chief Complaint  Patient presents with  . Fall  . Back Pain   The history is provided by the patient. No language interpreter was used.   [name] is a 46 y.o. female who presents to the Emergency Department complaining of gradually worsening constant mid/low back pain after she feel down 12 stairs last PM while she was moving boxes and missed a step. She did not hit or head or have LOC. She states her pain worsened when she woke up this AM.  She states pain was 10/10 at time of injury. She denies any weakness to her legs or any other injuries. No obvious deformities. She states took ibuprofen without any relief from her symptoms. She has no allergies.  There are no other associated systemic symptoms, there are no other alleviating or modifying factors.   Past Medical History  Diagnosis Date  . Hepatitis B     No past surgical history on file.  No family history on file.  History  Substance Use Topics  . Smoking status: Never Smoker   . Smokeless tobacco: Not on file  . Alcohol Use: No    OB History    Grav Para Term Preterm Abortions TAB SAB Ect Mult Living                  Review of Systems  Constitutional: Negative for fever and chills.  HENT: Negative for congestion and neck pain.   Respiratory: Negative for shortness of breath.   Gastrointestinal: Negative for nausea and vomiting.  Musculoskeletal: Positive for back pain.  Skin: Negative for color change.  Neurological: Negative for syncope, weakness and headaches.  All other systems reviewed and are negative.    Allergies  Acetaminophen and Peroxide  Home Medications   Current Outpatient Rx  Name Route Sig Dispense Refill  . CYCLOBENZAPRINE HCL 5 MG PO TABS Oral Take 1 tablet  (5 mg total) by mouth 3 (three) times daily as needed for muscle spasms. 20 tablet 0  . IBUPROFEN 600 MG PO TABS Oral Take 1 tablet (600 mg total) by mouth every 6 (six) hours as needed for pain. 30 tablet 0  . OXYCODONE HCL 5 MG PO TABS Oral Take 1 tablet (5 mg total) by mouth every 4 (four) hours as needed for pain. 30 tablet 0    Triage Vitals: BP 107/83  Pulse 104  Temp 98.8 F (37.1 C) (Oral)  Resp 16  SpO2 97%  Physical Exam  Nursing note and vitals reviewed. Constitutional: She is oriented to person, place, and time. She appears well-developed and well-nourished. No distress.  HENT:  Head: Normocephalic and atraumatic.  Eyes: EOM are normal.  Neck: Neck supple. No tracheal deviation present.  Cardiovascular: Normal rate.   Pulmonary/Chest: Effort normal. No respiratory distress.  Musculoskeletal: Normal range of motion. She exhibits tenderness (tender lumbar spine and thoracic spine. No C-spine tenderness.).  Neurological: She is alert and oriented to person, place, and time.  Skin: Skin is warm and dry.  Psychiatric: She has a normal mood and affect. Her behavior is normal.  Note- no CVA tenderness, also paraspinous tenderness of thoracic and lumbar spine Neuro- strength 5/5 in extremities x 4 sensation intact  ED  Course  Procedures (including critical care time) DIAGNOSTIC STUDIES: Oxygen Saturation is 97% on room air, adequate by my interpretation.    COORDINATION OF CARE: At 305 PM Discussed treatment plan with patient which includes pain/nausea medicine, and lumbar/thoracic X-ray. Patient agrees.   Labs Reviewed - No data to display No results found.   1. Back pain   2. Fall       MDM  Pt presenting with pain in upper and lower back after falling down stairs last night.  She states pain has been worsening over the approx 12 hours since the injury occurred.  xrays negative for acute fracture or other pathology.  Pt treated with IM pain meds in the ED.   Discharged with rx for muscle relaxer, pain control and anti-inflammatory, discussed strict return precautions.  Pt agreeable with plan.  I personally performed the services described in this documentation, which was scribed in my presence. The recorded information has been reviewed and considered.          Ethelda Chick, MD 06/16/12 [phone]

## 2012-06-13 NOTE — ED Notes (Signed)
Pt presents to department for evaluation of fall down stairs. States she was lifting several boxes last night when she missed step and fell down (12) stairs. Now states back pain. 10/10 pain at the time. No obvious deformities noted. Pt is alert and oriented x4.

## 2012-07-02 ENCOUNTER — Emergency Department (HOSPITAL_COMMUNITY)
Admission: EM | Admit: 2012-07-02 | Discharge: 2012-07-02 | Disposition: A | Discharge status: Home / Self Care | Payer: No Typology Code available for payment source | Attending: Emergency Medicine | Admitting: Emergency Medicine

## 2012-07-02 ENCOUNTER — Emergency Department (HOSPITAL_COMMUNITY): Payer: Self-pay

## 2012-07-02 ENCOUNTER — Encounter (HOSPITAL_COMMUNITY): Payer: Self-pay | Admitting: Emergency Medicine

## 2012-07-02 ENCOUNTER — Emergency Department (HOSPITAL_COMMUNITY): Payer: No Typology Code available for payment source

## 2012-07-02 DIAGNOSIS — M542 Cervicalgia: Secondary | ICD-10-CM | POA: Insufficient documentation

## 2012-07-02 DIAGNOSIS — S161XXA Strain of muscle, fascia and tendon at neck level, initial encounter: Secondary | ICD-10-CM

## 2012-07-02 DIAGNOSIS — R51 Headache: Secondary | ICD-10-CM | POA: Insufficient documentation

## 2012-07-02 DIAGNOSIS — F17200 Nicotine dependence, unspecified, uncomplicated: Secondary | ICD-10-CM | POA: Insufficient documentation

## 2012-07-02 DIAGNOSIS — D573 Sickle-cell trait: Secondary | ICD-10-CM | POA: Insufficient documentation

## 2012-07-02 DIAGNOSIS — Z8619 Personal history of other infectious and parasitic diseases: Secondary | ICD-10-CM | POA: Insufficient documentation

## 2012-07-02 HISTORY — DX: Sickle-cell trait: D57.3

## 2012-07-02 MED ORDER — OXYCODONE-ACETAMINOPHEN 5-325 MG PO TABS: 1.0000 | ORAL_TABLET | Freq: Three times a day (TID) | ORAL | Status: DC | PRN

## 2012-07-02 MED ORDER — OXYCODONE-ACETAMINOPHEN 5-325 MG PO TABS
1.0000 | ORAL_TABLET | Freq: Once | ORAL | Status: AC
  Administered 2012-07-02: 1 via ORAL
  Filled 2012-07-02: qty 1
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
## 2012-07-02 NOTE — ED Notes (Signed)
Patient transported to X-ray 

## 2012-07-02 NOTE — ED Notes (Signed)
c-collar placed at triage.

## 2012-07-02 NOTE — ED Notes (Signed)
Soft coller removed with order from [place] [facility]
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
## 2012-11-04 ENCOUNTER — Emergency Department (HOSPITAL_COMMUNITY)
Admission: EM | Admit: 2012-11-04 | Discharge: 2012-11-04 | Disposition: A | Discharge status: Home / Self Care | Payer: Self-pay | Attending: Emergency Medicine | Admitting: Emergency Medicine

## 2012-11-04 ENCOUNTER — Encounter (HOSPITAL_COMMUNITY): Payer: Self-pay | Admitting: Emergency Medicine

## 2012-11-04 ENCOUNTER — Emergency Department (HOSPITAL_COMMUNITY): Payer: Self-pay

## 2012-11-04 DIAGNOSIS — S6990XA Unspecified injury of unspecified wrist, hand and finger(s), initial encounter: Secondary | ICD-10-CM | POA: Insufficient documentation

## 2012-11-04 DIAGNOSIS — T07XXXA Unspecified multiple injuries, initial encounter: Secondary | ICD-10-CM | POA: Insufficient documentation

## 2012-11-04 DIAGNOSIS — S20219A Contusion of unspecified front wall of thorax, initial encounter: Secondary | ICD-10-CM | POA: Insufficient documentation

## 2012-11-04 DIAGNOSIS — IMO0002 Reserved for concepts with insufficient information to code with codable children: Secondary | ICD-10-CM | POA: Insufficient documentation

## 2012-11-04 DIAGNOSIS — M25559 Pain in unspecified hip: Secondary | ICD-10-CM | POA: Insufficient documentation

## 2012-11-04 DIAGNOSIS — D573 Sickle-cell trait: Secondary | ICD-10-CM | POA: Insufficient documentation

## 2012-11-04 DIAGNOSIS — F17200 Nicotine dependence, unspecified, uncomplicated: Secondary | ICD-10-CM | POA: Insufficient documentation

## 2012-11-04 DIAGNOSIS — M25519 Pain in unspecified shoulder: Secondary | ICD-10-CM | POA: Insufficient documentation

## 2012-11-04 DIAGNOSIS — Z8619 Personal history of other infectious and parasitic diseases: Secondary | ICD-10-CM | POA: Insufficient documentation

## 2012-11-04 MED ORDER — IBUPROFEN 800 MG PO TABS
800.0000 mg | ORAL_TABLET | Freq: Once | ORAL | Status: AC
  Administered 2012-11-04: 800 mg via ORAL
  Filled 2012-11-04: qty 1

## 2012-11-04 MED ORDER — IBUPROFEN 800 MG PO TABS: 800.0000 mg | ORAL_TABLET | Freq: Three times a day (TID) | ORAL | Status: DC | PRN

## 2012-11-04 MED ORDER — OXYCODONE HCL 5 MG PO TABS
5.0000 mg | ORAL_TABLET | Freq: Once | ORAL | Status: AC
  Administered 2012-11-04: 5 mg via ORAL
  Filled 2012-11-04: qty 1

## 2012-11-04 MED ORDER — OXYCODONE HCL 5 MG PO TABS: 5.0000 mg | ORAL_TABLET | Freq: Four times a day (QID) | ORAL | Status: DC | PRN
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
## 2012-11-04 NOTE — ED Provider Notes (Signed)
Medical screening examination/treatment/procedure(s) were performed by non-physician practitioner and as supervising physician I was immediately available for consultation/collaboration. Devoria Albe, MD, Armando Gang   Ward Givens, MD 11/04/12 [phone]
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
## 2012-12-26 ENCOUNTER — Encounter (HOSPITAL_COMMUNITY): Payer: Self-pay | Admitting: Emergency Medicine

## 2012-12-26 ENCOUNTER — Emergency Department (HOSPITAL_COMMUNITY)
Admission: EM | Admit: 2012-12-26 | Discharge: 2012-12-26 | Disposition: A | Discharge status: Home / Self Care | Payer: Self-pay | Attending: Emergency Medicine | Admitting: Emergency Medicine

## 2012-12-26 DIAGNOSIS — R509 Fever, unspecified: Secondary | ICD-10-CM | POA: Insufficient documentation

## 2012-12-26 DIAGNOSIS — J029 Acute pharyngitis, unspecified: Secondary | ICD-10-CM | POA: Insufficient documentation

## 2012-12-26 DIAGNOSIS — Z79899 Other long term (current) drug therapy: Secondary | ICD-10-CM | POA: Insufficient documentation

## 2012-12-26 DIAGNOSIS — Z8619 Personal history of other infectious and parasitic diseases: Secondary | ICD-10-CM | POA: Insufficient documentation

## 2012-12-26 DIAGNOSIS — Z862 Personal history of diseases of the blood and blood-forming organs and certain disorders involving the immune mechanism: Secondary | ICD-10-CM | POA: Insufficient documentation

## 2012-12-26 DIAGNOSIS — K047 Periapical abscess without sinus: Secondary | ICD-10-CM | POA: Insufficient documentation

## 2012-12-26 DIAGNOSIS — F17200 Nicotine dependence, unspecified, uncomplicated: Secondary | ICD-10-CM | POA: Insufficient documentation

## 2012-12-26 MED ORDER — OXYCODONE-ACETAMINOPHEN 5-325 MG PO TABS: 1.0000 | ORAL_TABLET | ORAL | Status: DC | PRN

## 2012-12-26 MED ORDER — PENICILLIN V POTASSIUM 500 MG PO TABS: 500.0000 mg | ORAL_TABLET | Freq: Four times a day (QID) | ORAL | Status: DC

## 2012-12-26 MED ORDER — IBUPROFEN 800 MG PO TABS: 800.0000 mg | ORAL_TABLET | Freq: Three times a day (TID) | ORAL | Status: DC | PRN

## 2012-12-26 MED ORDER — OXYCODONE-ACETAMINOPHEN 5-325 MG PO TABS
1.0000 | ORAL_TABLET | Freq: Once | ORAL | Status: AC
  Administered 2012-12-26: 1 via ORAL
  Filled 2012-12-26: qty 1

## 2012-12-26 NOTE — ED Provider Notes (Signed)
History     CSN: [id number]  Arrival date & time 12/26/12  1303   First MD Initiated Contact with Patient 12/26/12 1342      Chief Complaint  Patient presents with  . Dental Pain    recurrent pain on  lower l/side of mouth. Reports intermittent pain in this tooth x 2 years    (Consider location/radiation/quality/duration/timing/severity/associated sxs/prior treatment) HPI Comments: Patient reports she has had problems with her left lower teeth for some time, has been told she needs them pulled.  Four days ago, pt began to have increased pain in this area, followed by swelling two days ago.  Pain is throbbing, constant, worse with palpation and eating.  Mild sore throat.  Reports temperature to 100 yesterday.  Taking ibuprofen without improvement.  Denies difficulty swallowing or breathing, change in voice.    Pt has acetaminophen listed as allergy, but denies this. States she accidentally overdosed on tylenol several years ago but her liver has made a full recovery and she now takes it over the counter as needed.    Patient is a 47 y.o. female presenting with tooth pain. The history is provided by the patient.  Dental PainThe primary symptoms include fever. Primary symptoms do not include shortness of breath or sore throat.  Additional symptoms do not include: trouble swallowing.    Past Medical History  Diagnosis Date  . Hepatitis B   . Sickle cell trait     History reviewed. No pertinent past surgical history.  Family History  Problem Relation Age of Onset  . Cancer Other   . Diabetes Other     History  Substance Use Topics  . Smoking status: Current Every Day Smoker  . Smokeless tobacco: Not on file  . Alcohol Use: No    OB History   Grav Para Term Preterm Abortions TAB SAB Ect Mult Living                  Review of Systems  Constitutional: Positive for fever. Negative for chills.  HENT: Positive for dental problem. Negative for sore throat, trouble swallowing  and voice change.   Respiratory: Negative for shortness of breath, wheezing and stridor.     Allergies  Acetaminophen and Peroxide  Home Medications   Current Outpatient Rx  Name  Route  Sig  Dispense  Refill  . BIOTIN PO   Oral   Take 1 capsule by mouth daily.         . fish oil-omega-3 fatty acids 1000 MG capsule   Oral   Take 1 g by mouth daily.         Marland Kitchen MILK THISTLE PO   Oral   Take 1 capsule by mouth daily.         . Multiple Vitamin (MULTIVITAMIN WITH MINERALS) TABS   Oral   Take 1 tablet by mouth daily.         . naproxen sodium (ANAPROX) 220 MG tablet   Oral   Take 220 mg by mouth 2 (two) times daily as needed. For pain.           BP 119/82  Pulse 102  Temp(Src) 98.8 F (37.1 C) (Oral)  SpO2 96%  LMP 12/26/2012  Physical Exam  Nursing note and vitals reviewed. Constitutional: She appears well-developed and well-nourished. No distress.  HENT:  Head: Normocephalic and atraumatic.    Mouth/Throat: Oropharynx is clear and moist. No oropharyngeal exudate.    Neck: Neck supple.  Cardiovascular:  Normal rate.   Pulmonary/Chest: Effort normal and breath sounds normal. No stridor.  Neurological: She is alert.  Skin: She is not diaphoretic.    ED Course  Procedures (including critical care time)  Labs Reviewed - No data to display No results found.   1. Dental abscess     MDM  Pt with dental abscess related to left lower dentition. Pt with moderate swelling under jaw.  Has sore throat but is afebrile, nontoxic, handling oral secretions without difficulty.  There was a question of trismus initially that patient now denies - she does talk easily and moves her jaw normally.  Apparently it was related to pain.  I discussed with the patient at length the possibility of a deep space infection and the dangers of deep space infection.  Pt verbalized understanding but insists that her "difficulty" moving her jaw was related to the amount of swelling  involved and discomfort of moving anything in that area.  I have stressed the importance of the antibiotics and of close follow up.  Discussed return precautions and have advised her to no hesitate to return should her symptoms get worse.    Discussed diagnosis, treatment, and plan patient.  Pt given return precautions.  Pt verbalizes understanding and agrees with plan.           Trixie Dredge, PA-C 12/26/12 1523

## 2012-12-26 NOTE — ED Provider Notes (Signed)
Medical screening examination/treatment/procedure(s) were performed by non-physician practitioner and as supervising physician I was immediately available for consultation/collaboration.  Juliet Rude. Rubin Payor, MD 12/26/12 1616

## 2012-12-26 NOTE — ED Notes (Signed)
Pt reports swelling and pain on l/lower side of mouth. Stated that she has been for same concern over the last two years. Waiting to have tooth pulled

## 2012-12-28 ENCOUNTER — Encounter (HOSPITAL_COMMUNITY): Payer: Self-pay | Admitting: Emergency Medicine

## 2012-12-28 ENCOUNTER — Emergency Department (HOSPITAL_COMMUNITY)
Admission: EM | Admit: 2012-12-28 | Discharge: 2012-12-28 | Disposition: A | Discharge status: Home / Self Care | Payer: Self-pay | Attending: Emergency Medicine | Admitting: Emergency Medicine

## 2012-12-28 ENCOUNTER — Telehealth (HOSPITAL_COMMUNITY): Payer: Self-pay | Admitting: Emergency Medicine

## 2012-12-28 DIAGNOSIS — F17200 Nicotine dependence, unspecified, uncomplicated: Secondary | ICD-10-CM | POA: Insufficient documentation

## 2012-12-28 DIAGNOSIS — R221 Localized swelling, mass and lump, neck: Secondary | ICD-10-CM | POA: Insufficient documentation

## 2012-12-28 DIAGNOSIS — Z79899 Other long term (current) drug therapy: Secondary | ICD-10-CM | POA: Insufficient documentation

## 2012-12-28 DIAGNOSIS — Z862 Personal history of diseases of the blood and blood-forming organs and certain disorders involving the immune mechanism: Secondary | ICD-10-CM | POA: Insufficient documentation

## 2012-12-28 DIAGNOSIS — R509 Fever, unspecified: Secondary | ICD-10-CM | POA: Insufficient documentation

## 2012-12-28 DIAGNOSIS — R220 Localized swelling, mass and lump, head: Secondary | ICD-10-CM | POA: Insufficient documentation

## 2012-12-28 DIAGNOSIS — Z8619 Personal history of other infectious and parasitic diseases: Secondary | ICD-10-CM | POA: Insufficient documentation

## 2012-12-28 DIAGNOSIS — K047 Periapical abscess without sinus: Secondary | ICD-10-CM | POA: Insufficient documentation

## 2012-12-28 MED ORDER — AMOXICILLIN-POT CLAVULANATE 875-125 MG PO TABS: 1.0000 | ORAL_TABLET | Freq: Two times a day (BID) | ORAL | Status: DC

## 2012-12-28 MED ORDER — TRAMADOL HCL 50 MG PO TABS: 50.0000 mg | ORAL_TABLET | Freq: Four times a day (QID) | ORAL | Status: DC | PRN

## 2012-12-28 MED ORDER — LIDOCAINE-EPINEPHRINE 2 %-1:100000 IJ SOLN: 20.0000 mL | Freq: Once | INTRAMUSCULAR | Status: DC

## 2012-12-28 NOTE — ED Notes (Signed)
Pharmacy called for clarification of augmentin prescription and call transferred to Dr Blinda Leatherwood for further clarification

## 2012-12-28 NOTE — ED Notes (Signed)
MD at bedside. 

## 2012-12-28 NOTE — ED Provider Notes (Signed)
History     CSN: [id number]  Arrival date & time 12/28/12  0630   First MD Initiated Contact with Patient 12/28/12 [phone]      Chief Complaint  Patient presents with  . Dental Pain  . Oral Swelling    (Consider location/radiation/quality/duration/timing/severity/associated sxs/prior treatment) HPI Comments: Patient returns to the ER for evaluation of continued problems with dental abscess. Patient was seen yesterday and initiated on Percocet and penicillin. Patient reports that since then she has had increasing pain, causing her to have trouble swallowing. Pain is now severe. She thinks she has had a fever.  Patient is a 47 y.o. female presenting with tooth pain.  Dental PainThe primary symptoms include fever.    Past Medical History  Diagnosis Date  . Hepatitis B   . Sickle cell trait     History reviewed. No pertinent past surgical history.  Family History  Problem Relation Age of Onset  . Cancer Other   . Diabetes Other     History  Substance Use Topics  . Smoking status: Current Every Day Smoker -- 0.50 packs/day  . Smokeless tobacco: Not on file  . Alcohol Use: No    OB History   Grav Para Term Preterm Abortions TAB SAB Ect Mult Living                  Review of Systems  Constitutional: Positive for fever.  HENT: Positive for dental problem.   All other systems reviewed and are negative.    Allergies  Acetaminophen and Peroxide  Home Medications   Current Outpatient Rx  Name  Route  Sig  Dispense  Refill  . BIOTIN PO   Oral   Take 1 capsule by mouth daily.         . fish oil-omega-3 fatty acids 1000 MG capsule   Oral   Take 1 g by mouth daily.         Marland Kitchen ibuprofen (ADVIL,MOTRIN) 800 MG tablet   Oral   Take 1 tablet (800 mg total) by mouth every 8 (eight) hours as needed for pain.   21 tablet   0   . MILK THISTLE PO   Oral   Take 1 capsule by mouth daily.         . Multiple Vitamin (MULTIVITAMIN WITH MINERALS) TABS   Oral   Take  1 tablet by mouth daily.         . naproxen sodium (ANAPROX) 220 MG tablet   Oral   Take 220 mg by mouth 2 (two) times daily as needed. For pain.         Marland Kitchen oxyCODONE-acetaminophen (PERCOCET/ROXICET) 5-325 MG per tablet   Oral   Take 1-2 tablets by mouth every 4 (four) hours as needed for pain.   20 tablet   0   . penicillin v potassium (VEETID) 500 MG tablet   Oral   Take 1 tablet (500 mg total) by mouth 4 (four) times daily.   40 tablet   0     BP 139/88  Pulse 113  Temp(Src) 98.5 F (36.9 C) (Oral)  Resp 18  Wt 197 lb (89.359 kg)  BMI 30.85 kg/m2  SpO2 97%  LMP 12/26/2012  Physical Exam  Constitutional: She is oriented to person, place, and time. She appears well-developed and well-nourished. No distress.  HENT:  Head: Normocephalic and atraumatic.  Right Ear: Hearing normal.  Nose: Nose normal.  Mouth/Throat: Oropharynx is clear and moist and mucous membranes  are normal.    Eyes: Conjunctivae and EOM are normal. Pupils are equal, round, and reactive to light.  Neck: Normal range of motion. Neck supple.  Cardiovascular: Normal rate, regular rhythm, S1 normal and S2 normal.  Exam reveals no gallop and no friction rub.   No murmur heard. Pulmonary/Chest: Effort normal and breath sounds normal. No respiratory distress. She exhibits no tenderness.  Abdominal: Soft. Normal appearance and bowel sounds are normal. There is no hepatosplenomegaly. There is no tenderness. There is no rebound, no guarding, no tenderness at McBurney's point and negative Murphy's sign. No hernia.  Musculoskeletal: Normal range of motion.  Neurological: She is alert and oriented to person, place, and time. She has normal strength. No cranial nerve deficit or sensory deficit. Coordination normal. GCS eye subscore is 4. GCS verbal subscore is 5. GCS motor subscore is 6.  Skin: Skin is warm, dry and intact. No rash noted. No cyanosis.  Psychiatric: She has a normal mood and affect. Her speech is  normal and behavior is normal. Thought content normal.    ED Course  Procedures (including critical care time)  Inferior Alveolar Nerve Block:  Standard technique followed. Landmarks identified and a 25G 1 1/2 inch needle was used to inject Lidocaine 2% with Epi, 4ml injected.  Incision and Drainage: An 18G needle was used to pierce the area of fluctuance and a moderate amount of pus drained from the abscess.  Labs Reviewed - No data to display No results found.   Diagnosis: Dental Abscess    MDM  Patient presented to the ER for evaluation of progressively worsening pain secondary to dental abscess. Patient has a 1 cm fluctuant area on the medial aspect of the lower gingiva associated with a molar. I recommended drainage of this area. After discussing risks and benefits, explaining the procedure, patient verbally consented. A moderate amount of pus was drained from the site. Patient's antibiotic will be changed to Augmentin.        Gilda Crease, MD 12/28/12 [phone]

## 2012-12-28 NOTE — ED Notes (Signed)
Pt states she was seen here Sunday and was told to come back if she experienced increased swelling or pain.  Pt reports she has been taking Rx as prescribed but has been unable to swallow d/t pain and swelling.  Pt states she has also been having cold chills from this.

## 2013-03-10 ENCOUNTER — Emergency Department (HOSPITAL_COMMUNITY)
Admission: EM | Admit: 2013-03-10 | Discharge: 2013-03-11 | Disposition: A | Discharge status: Home / Self Care | Payer: Self-pay | Attending: Emergency Medicine | Admitting: Emergency Medicine

## 2013-03-10 ENCOUNTER — Emergency Department (HOSPITAL_COMMUNITY): Payer: Self-pay

## 2013-03-10 ENCOUNTER — Encounter (HOSPITAL_COMMUNITY): Payer: Self-pay

## 2013-03-10 DIAGNOSIS — D573 Sickle-cell trait: Secondary | ICD-10-CM | POA: Insufficient documentation

## 2013-03-10 DIAGNOSIS — Z8619 Personal history of other infectious and parasitic diseases: Secondary | ICD-10-CM | POA: Insufficient documentation

## 2013-03-10 DIAGNOSIS — F17200 Nicotine dependence, unspecified, uncomplicated: Secondary | ICD-10-CM | POA: Insufficient documentation

## 2013-03-10 DIAGNOSIS — R319 Hematuria, unspecified: Secondary | ICD-10-CM | POA: Insufficient documentation

## 2013-03-10 DIAGNOSIS — Z79899 Other long term (current) drug therapy: Secondary | ICD-10-CM | POA: Insufficient documentation

## 2013-03-10 DIAGNOSIS — Z3202 Encounter for pregnancy test, result negative: Secondary | ICD-10-CM | POA: Insufficient documentation

## 2013-03-10 DIAGNOSIS — R3915 Urgency of urination: Secondary | ICD-10-CM | POA: Insufficient documentation

## 2013-03-10 DIAGNOSIS — N390 Urinary tract infection, site not specified: Secondary | ICD-10-CM | POA: Insufficient documentation

## 2013-03-10 DIAGNOSIS — M549 Dorsalgia, unspecified: Secondary | ICD-10-CM | POA: Insufficient documentation

## 2013-03-10 DIAGNOSIS — R509 Fever, unspecified: Secondary | ICD-10-CM | POA: Insufficient documentation

## 2013-03-10 MED ORDER — HYDROMORPHONE HCL PF 1 MG/ML IJ SOLN
1.0000 mg | Freq: Once | INTRAMUSCULAR | Status: AC
  Administered 2013-03-11: 1 mg via INTRAMUSCULAR
  Filled 2013-03-10: qty 1

## 2013-03-10 NOTE — ED Provider Notes (Signed)
History     CSN: [id number]  Arrival date & time 03/10/13  2233   First MD Initiated Contact with Patient 03/10/13 2307      Chief Complaint  Patient presents with  . Urinary Frequency  . Hematuria    (Consider location/radiation/quality/duration/timing/severity/associated sxs/prior treatment) HPI Comments: 47 year old female presents to the emergency department complaining of suprapubic abdominal pain x2 days. Patient states over the past 12 days she has had symptoms of a urinary tract infection including increased urinary frequency, urgency and foul-smelling urine. 5 days ago she developed bilateral flank pain and urgency got worse. Her suprapubic abdominal pain is described as throbbing, coming and going in waves rated 10 out of 10. She has not had any alleviating factors for her pain. For the urinary tract infection she has been drinking a lot of cranberry juice and water. Today she began to notice some blood in her urine. Had a fever of 101 yesterday. Denies nausea or vomiting.  Patient is a 47 y.o. female presenting with frequency and hematuria. The history is provided by the patient.  Urinary Frequency Associated symptoms include abdominal pain and a fever. Pertinent negatives include no chills, nausea or vomiting.  Hematuria Associated symptoms include abdominal pain and a fever. Pertinent negatives include no chills, nausea or vomiting.    Past Medical History  Diagnosis Date  . Hepatitis B   . Sickle cell trait     History reviewed. No pertinent past surgical history.  Family History  Problem Relation Age of Onset  . Cancer Other   . Diabetes Other     History  Substance Use Topics  . Smoking status: Current Every Day Smoker -- 0.50 packs/day  . Smokeless tobacco: Not on file  . Alcohol Use: No    OB History   Grav Para Term Preterm Abortions TAB SAB Ect Mult Living                  Review of Systems  Constitutional: Positive for fever. Negative for  chills.  Gastrointestinal: Positive for abdominal pain. Negative for nausea and vomiting.  Genitourinary: Positive for urgency, frequency and hematuria. Negative for vaginal bleeding, vaginal discharge, difficulty urinating and vaginal pain.  Musculoskeletal: Positive for back pain.  All other systems reviewed and are negative.    Allergies  Acetaminophen and Peroxide  Home Medications   Current Outpatient Rx  Name  Route  Sig  Dispense  Refill  . amoxicillin-clavulanate (AUGMENTIN) 875-125 MG per tablet   Oral   Take 1 tablet by mouth 2 (two) times daily. One po bid x 7 days   20 tablet   0   . BIOTIN PO   Oral   Take 1 capsule by mouth daily.         . fish oil-omega-3 fatty acids 1000 MG capsule   Oral   Take 1 g by mouth daily.         Marland Kitchen ibuprofen (ADVIL,MOTRIN) 800 MG tablet   Oral   Take 1 tablet (800 mg total) by mouth every 8 (eight) hours as needed for pain.   21 tablet   0   . MILK THISTLE PO   Oral   Take 1 capsule by mouth daily.         . Multiple Vitamin (MULTIVITAMIN WITH MINERALS) TABS   Oral   Take 1 tablet by mouth daily.         . naproxen sodium (ANAPROX) 220 MG tablet   Oral  Take 220 mg by mouth 2 (two) times daily as needed. For pain.         Marland Kitchen oxyCODONE-acetaminophen (PERCOCET/ROXICET) 5-325 MG per tablet   Oral   Take 1-2 tablets by mouth every 4 (four) hours as needed for pain.   20 tablet   0   . traMADol (ULTRAM) 50 MG tablet   Oral   Take 1 tablet (50 mg total) by mouth every 6 (six) hours as needed for pain.   15 tablet   0     BP 132/63  Pulse 95  Temp(Src) 98.2 F (36.8 C) (Oral)  Resp 20  SpO2 99%  LMP 03/07/2013  Physical Exam  Nursing note and vitals reviewed. Constitutional: She is oriented to person, place, and time. She appears well-developed and well-nourished. No distress.  HENT:  Head: Normocephalic and atraumatic.  Mouth/Throat: Oropharynx is clear and moist.  Eyes: Conjunctivae are normal.   Neck: Normal range of motion. Neck supple.  Cardiovascular: Normal rate, regular rhythm, normal heart sounds and intact distal pulses.   Pulmonary/Chest: Effort normal and breath sounds normal. No respiratory distress.  Abdominal: Soft. Normal appearance and bowel sounds are normal. She exhibits no distension and no mass. There is tenderness. There is CVA tenderness (bilateral). There is no rigidity, no rebound and no guarding.    Musculoskeletal: Normal range of motion. She exhibits no edema.  Neurological: She is alert and oriented to person, place, and time.  Skin: Skin is warm and dry. She is not diaphoretic.  Psychiatric: She has a normal mood and affect. Her behavior is normal.    ED Course  Procedures (including critical care time)  Labs Reviewed  URINALYSIS, ROUTINE W REFLEX MICROSCOPIC - Abnormal; Notable for the following:    APPearance CLOUDY (*)    Leukocytes, UA MODERATE (*)    All other components within normal limits  URINE MICROSCOPIC-ADD ON - Abnormal; Notable for the following:    Squamous Epithelial / LPF FEW (*)    Bacteria, UA MANY (*)    All other components within normal limits   Ct Abdomen Pelvis Wo Contrast  03/11/2013   *RADIOLOGY REPORT*  Clinical Data: Hematuria, increased urinary frequency, flank pain and pain overlying the bladder.  CT ABDOMEN AND PELVIS WITHOUT CONTRAST  Technique:  Multidetector CT imaging of the abdomen and pelvis was performed following the standard protocol without intravenous contrast.  Comparison: 05/05/2008  Findings: There is no evidence of renal obstruction or calculi. The bladder is nearly decompressed and unremarkable in appearance. No acute inflammatory process or abnormal fluid collection is identified.  Unenhanced appearance of the liver, gallbladder, pancreas, spleen, adrenal glands and bowel are unremarkable.  There is diverticulosis of the sigmoid colon without evidence of diverticulitis.  No hernias are identified.  No  abnormal masses or enlarged lymph nodes are seen.  Bony structures are unremarkable.  IMPRESSION: No renal or bladder abnormalities are identified by unenhanced CT.   Original Report Authenticated By: Irish Lack, M.D.     1. Urinary tract infection       MDM  Suprapubic and back pain, urinary urgency, hematuria. Obtaining u/a, CT abdomen/pelvis to r/o kidney stone. Dilaudid for pain. No nausea at this time. 12:20 AM CT scan without any acute abnormality. No evidence of stones. Positive UTI. Patient is in NAD. Discharge with keflex, vicodin for pain. F/u with her PCP. Afebrile, normal vital signs. Return precautions discussed. Patient states understanding of plan and is agreeable.    Nada Boozer  Mathis Fare, PA-C 03/11/13 [phone]

## 2013-03-10 NOTE — ED Notes (Signed)
Pt complains of blood in urine and severe bladder pain for 14 days

## 2013-03-11 LAB — URINALYSIS, ROUTINE W REFLEX MICROSCOPIC
Bilirubin Urine: NEGATIVE
Nitrite: NEGATIVE
Specific Gravity, Urine: 1.016 (ref 1.005–1.030)
Urobilinogen, UA: 0.2 mg/dL (ref 0.0–1.0)

## 2013-03-11 LAB — URINE MICROSCOPIC-ADD ON

## 2013-03-11 MED ORDER — HYDROCODONE-ACETAMINOPHEN 5-325 MG PO TABS: 1.0000 | ORAL_TABLET | ORAL | Status: DC | PRN

## 2013-03-11 MED ORDER — CEPHALEXIN 500 MG PO CAPS: 500.0000 mg | ORAL_CAPSULE | Freq: Four times a day (QID) | ORAL | Status: DC

## 2013-03-11 NOTE — ED Provider Notes (Signed)
Medical screening examination/treatment/procedure(s) were performed by non-physician practitioner and as supervising physician I was immediately available for consultation/collaboration.  Sunnie Nielsen, MD 03/11/13 [phone]

## 2013-03-12 LAB — URINE CULTURE

## 2013-03-13 ENCOUNTER — Telehealth (HOSPITAL_COMMUNITY): Payer: Self-pay | Admitting: Emergency Medicine

## 2013-03-13 NOTE — ED Notes (Signed)
Post ED Visit - Positive Culture Follow-up  Culture report reviewed by antimicrobial stewardship pharmacist: []  Wes Dulaney, Pharm.D., BCPS []  Celedonio Miyamoto, Pharm.D., BCPS []  Georgina Pillion, [address].D., BCPS []  [place], [address].D., BCPS, AAHIVP []  Estella Husk, Pharm.D., BCPS, AAHIVP [x]  Abran Duke, [address].D., BCPS  Positive urine culture Treated with Keflex, organism sensitive to the same and no further patient follow-up is required at this time.  Kylie A Holland 03/13/2013, 10:35 AM

## 2013-06-18 ENCOUNTER — Emergency Department (HOSPITAL_COMMUNITY)
Admission: EM | Admit: 2013-06-18 | Discharge: 2013-06-19 | Disposition: A | Discharge status: Home / Self Care | Payer: No Typology Code available for payment source | Attending: Emergency Medicine | Admitting: Emergency Medicine

## 2013-06-18 ENCOUNTER — Emergency Department (HOSPITAL_COMMUNITY): Payer: No Typology Code available for payment source

## 2013-06-18 ENCOUNTER — Encounter (HOSPITAL_COMMUNITY): Payer: Self-pay | Admitting: Emergency Medicine

## 2013-06-18 DIAGNOSIS — R109 Unspecified abdominal pain: Secondary | ICD-10-CM | POA: Insufficient documentation

## 2013-06-18 DIAGNOSIS — B9689 Other specified bacterial agents as the cause of diseases classified elsewhere: Secondary | ICD-10-CM

## 2013-06-18 DIAGNOSIS — Z3202 Encounter for pregnancy test, result negative: Secondary | ICD-10-CM | POA: Insufficient documentation

## 2013-06-18 DIAGNOSIS — R509 Fever, unspecified: Secondary | ICD-10-CM | POA: Insufficient documentation

## 2013-06-18 DIAGNOSIS — F17200 Nicotine dependence, unspecified, uncomplicated: Secondary | ICD-10-CM | POA: Insufficient documentation

## 2013-06-18 DIAGNOSIS — R300 Dysuria: Secondary | ICD-10-CM | POA: Insufficient documentation

## 2013-06-18 DIAGNOSIS — Z8619 Personal history of other infectious and parasitic diseases: Secondary | ICD-10-CM | POA: Insufficient documentation

## 2013-06-18 DIAGNOSIS — Z862 Personal history of diseases of the blood and blood-forming organs and certain disorders involving the immune mechanism: Secondary | ICD-10-CM | POA: Insufficient documentation

## 2013-06-18 DIAGNOSIS — N760 Acute vaginitis: Secondary | ICD-10-CM | POA: Insufficient documentation

## 2013-06-18 DIAGNOSIS — R61 Generalized hyperhidrosis: Secondary | ICD-10-CM | POA: Insufficient documentation

## 2013-06-18 LAB — CBC WITH DIFFERENTIAL/PLATELET
Basophils Absolute: 0 10*3/uL (ref 0.0–0.1)
Eosinophils Absolute: 0.2 10*3/uL (ref 0.0–0.7)
Eosinophils Relative: 1 % (ref 0–5)
HCT: 37.4 % (ref 36.0–46.0)
Lymphocytes Relative: 27 % (ref 12–46)
MCH: 29.8 pg (ref 26.0–34.0)
MCHC: 35.6 g/dL (ref 30.0–36.0)
MCV: 83.7 fL (ref 78.0–100.0)
Monocytes Absolute: 0.7 10*3/uL (ref 0.1–1.0)
Neutrophils Relative %: 67 % (ref 43–77)
RDW: 13.6 % (ref 11.5–15.5)
WBC: 13 10*3/uL — ABNORMAL HIGH (ref 4.0–10.5)

## 2013-06-18 LAB — BASIC METABOLIC PANEL
CO2: 23 mEq/L (ref 19–32)
Calcium: 9.1 mg/dL (ref 8.4–10.5)
Creatinine, Ser: 0.66 mg/dL (ref 0.50–1.10)
Sodium: 136 mEq/L (ref 135–145)

## 2013-06-18 LAB — WET PREP, GENITAL
Trich, Wet Prep: NONE SEEN
Yeast Wet Prep HPF POC: NONE SEEN

## 2013-06-18 LAB — URINALYSIS, ROUTINE W REFLEX MICROSCOPIC
Bilirubin Urine: NEGATIVE
Hgb urine dipstick: NEGATIVE
Ketones, ur: NEGATIVE mg/dL
Protein, ur: NEGATIVE mg/dL
Urobilinogen, UA: 0.2 mg/dL (ref 0.0–1.0)

## 2013-06-18 MED ORDER — CEFTRIAXONE SODIUM 250 MG IJ SOLR
250.0000 mg | Freq: Once | INTRAMUSCULAR | Status: DC
  Administered 2013-06-18: 250 mg via INTRAMUSCULAR

## 2013-06-18 MED ORDER — DEXTROSE 5 % IV SOLN
250.0000 mg | Freq: Once | INTRAVENOUS | Status: AC
  Administered 2013-06-18: 250 mg via INTRAVENOUS
  Filled 2013-06-18: qty 250

## 2013-06-18 MED ORDER — HYDROMORPHONE HCL PF 1 MG/ML IJ SOLN
1.0000 mg | Freq: Once | INTRAMUSCULAR | Status: AC
  Administered 2013-06-18: 1 mg via INTRAVENOUS
  Filled 2013-06-18: qty 1

## 2013-06-18 MED ORDER — HYDROMORPHONE HCL PF 1 MG/ML IJ SOLN
0.5000 mg | Freq: Once | INTRAMUSCULAR | Status: AC
  Administered 2013-06-18: 0.5 mg via INTRAVENOUS
  Filled 2013-06-18: qty 1

## 2013-06-18 MED ORDER — AZITHROMYCIN 250 MG PO TABS
1000.0000 mg | ORAL_TABLET | Freq: Once | ORAL | Status: AC
  Administered 2013-06-18: 1000 mg via ORAL
  Filled 2013-06-18: qty 4

## 2013-06-18 NOTE — ED Notes (Signed)
Pt from home reports abd pain. Pt states that she was dx a "couple of years ago with cysts". Pt reports that she was told they were uterine cysts. Pt denies N/D but reports fever of 100.0 last pm, took ibuprofen, but received no relief from pain. Pt is A&O and in NAD

## 2013-06-18 NOTE — ED Provider Notes (Signed)
CSN: [id number]     Arrival date & time 06/18/13  1844 History   First MD Initiated Contact with Patient 06/18/13 1922     Chief Complaint  Patient presents with  . Abdominal Pain    HPI  [name] is a 47 y.o. female with a PMH of hepatitis B and sickle cell trait who presents to the ED for evaluation of abdominal pain.  History was provided by the patient.  Patient states that she developed severe lower middle abdominal/pelvic pain which has been gradually worsening over the past 48 hours. Her pain radiates around her lower abdomen but she has no back pain.  Her pain comes and goes and feels like a "contraction."  She also complains of dysuria. Her pain is also worse with standing.  She has a low grade fever of 100 and has been having alternating chills and diaphoresis.  She has a hx of ovarian or uterine cysts and her pain is similar to her pain from that in the past.  Her LNMP was 06/13/13.  She denies any vaginal bleeding or discharge.  She is currently sexually active.  She has been taking Ibuprofen with no relief. She denies any diarrhea, emesis, nausea, rectal pain, or hematochezia.  She otherwise has been well with no rhinorrhea, congestion, sore throat, cough, chest pain, SOB, leg edema, headache, dizziness or lightheadedness.     Past Medical History  Diagnosis Date  . Hepatitis B   . Sickle cell trait    Past Surgical History  Procedure Laterality Date  . Wisdom tooth extraction    . Incision and drainage intra oral abscess     Family History  Problem Relation Age of Onset  . Cancer Other   . Diabetes Other    History  Substance Use Topics  . Smoking status: Current Every Day Smoker -- 0.50 packs/day  . Smokeless tobacco: Not on file  . Alcohol Use: No   OB History   Grav Para Term Preterm Abortions TAB SAB Ect Mult Living                 Review of Systems  Constitutional: Positive for fever, chills and diaphoresis. Negative for activity change, appetite change  and fatigue.  HENT: Negative for ear pain, congestion, sore throat, rhinorrhea, neck pain and neck stiffness.   Eyes: Negative for visual disturbance.  Respiratory: Negative for cough and shortness of breath.   Cardiovascular: Negative for chest pain and leg swelling.  Gastrointestinal: Positive for abdominal pain. Negative for nausea, vomiting, diarrhea, constipation, blood in stool and anal bleeding.  Genitourinary: Positive for dysuria. Negative for hematuria, decreased urine volume, vaginal bleeding, vaginal discharge and genital sores.  Musculoskeletal: Negative for back pain and gait problem.  Skin: Negative for wound.  Neurological: Negative for dizziness, syncope, weakness, light-headedness, numbness and headaches.  Psychiatric/Behavioral: Negative for confusion.    Allergies  Acetaminophen and Peroxide  Home Medications   Current Outpatient Rx  Name  Route  Sig  Dispense  Refill  . BIOTIN PO   Oral   Take 1 capsule by mouth daily.         . Cyanocobalamin (VITAMIN B 12 PO)   Oral   Take 1 tablet by mouth daily.         . fish oil-omega-3 fatty acids 1000 MG capsule   Oral   Take 1 g by mouth daily.         Marland Kitchen ibuprofen (ADVIL,MOTRIN) 800 MG tablet  Oral   Take 1 tablet (800 mg total) by mouth every 8 (eight) hours as needed for pain.   21 tablet   0   . MILK THISTLE PO   Oral   Take 1 capsule by mouth daily.         . naproxen sodium (ANAPROX) 220 MG tablet   Oral   Take 220 mg by mouth 2 (two) times daily as needed. For pain.         . Multiple Vitamin (MULTIVITAMIN WITH MINERALS) TABS   Oral   Take 1 tablet by mouth daily.          BP 123/88  Pulse 95  Temp(Src) 98.5 F (36.9 C) (Oral)  Resp 20  SpO2 97%  LMP 05/18/2013  Filed Vitals:   06/18/13 1907 06/18/13 2121  BP: 123/88 132/92  Pulse: 95 99  Temp: 98.5 F (36.9 C) 98.5 F (36.9 C)  TempSrc: Oral   Resp: 20   SpO2: 97% 100%    Physical Exam  Nursing note and vitals  reviewed. Constitutional: She is oriented to person, place, and time. She appears well-developed and well-nourished. No distress.  HENT:  Head: Normocephalic and atraumatic.  Right Ear: External ear normal.  Left Ear: External ear normal.  Nose: Nose normal.  Mouth/Throat: Oropharynx is clear and moist. No oropharyngeal exudate.  Eyes: Conjunctivae are normal. Pupils are equal, round, and reactive to light. Right eye exhibits no discharge. Left eye exhibits no discharge.  Neck: Normal range of motion. Neck supple.  Cardiovascular: Normal rate, regular rhythm, normal heart sounds and intact distal pulses.  Exam reveals no gallop and no friction rub.   No murmur heard. Pulmonary/Chest: Effort normal and breath sounds normal. No respiratory distress. She has no wheezes. She has no rales. She exhibits no tenderness.  Abdominal: Soft. Bowel sounds are normal. She exhibits no distension and no mass. There is tenderness. There is no rebound and no guarding.  LLQ and middle lower pelvic tenderness to palpation.  No RLQ tenderness.    Musculoskeletal: Normal range of motion. She exhibits no edema and no tenderness.  Neurological: She is alert and oriented to person, place, and time.  Skin: Skin is warm and dry. She is not diaphoretic.    ED Course  Procedures (including critical care time) Labs Review Labs Reviewed  URINALYSIS, ROUTINE W REFLEX MICROSCOPIC  PREGNANCY, URINE   Imaging Review No results found.  Results for orders placed during the hospital encounter of 06/18/13  WET PREP, GENITAL      Result Value Range   Yeast Wet Prep HPF POC NONE SEEN  NONE SEEN   Trich, Wet Prep NONE SEEN  NONE SEEN   Clue Cells Wet Prep HPF POC FEW (*) NONE SEEN   WBC, Wet Prep HPF POC RARE (*) NONE SEEN  URINALYSIS, ROUTINE W REFLEX MICROSCOPIC      Result Value Range   Color, Urine YELLOW  YELLOW   APPearance CLEAR  CLEAR   Specific Gravity, Urine 1.018  1.005 - 1.030   pH 6.5  5.0 - 8.0    Glucose, UA NEGATIVE  NEGATIVE mg/dL   Hgb urine dipstick NEGATIVE  NEGATIVE   Bilirubin Urine NEGATIVE  NEGATIVE   Ketones, ur NEGATIVE  NEGATIVE mg/dL   Protein, ur NEGATIVE  NEGATIVE mg/dL   Urobilinogen, UA 0.2  0.0 - 1.0 mg/dL   Nitrite NEGATIVE  NEGATIVE   Leukocytes, UA NEGATIVE  NEGATIVE  PREGNANCY, URINE  Result Value Range   Preg Test, Ur NEGATIVE  NEGATIVE  CBC WITH DIFFERENTIAL      Result Value Range   WBC 13.0 (*) 4.0 - 10.5 K/uL   RBC 4.47  3.87 - 5.11 MIL/uL   Hemoglobin 13.3  12.0 - 15.0 g/dL   HCT 40.9  81.1 - 91.4 %   MCV 83.7  78.0 - 100.0 fL   MCH 29.8  26.0 - 34.0 pg   MCHC 35.6  30.0 - 36.0 g/dL   RDW 78.2  95.6 - 21.3 %   Platelets 304  150 - 400 K/uL   Neutrophils Relative % 67  43 - 77 %   Neutro Abs 8.6 (*) 1.7 - 7.7 K/uL   Lymphocytes Relative 27  12 - 46 %   Lymphs Abs 3.5  0.7 - 4.0 K/uL   Monocytes Relative 5  3 - 12 %   Monocytes Absolute 0.7  0.1 - 1.0 K/uL   Eosinophils Relative 1  0 - 5 %   Eosinophils Absolute 0.2  0.0 - 0.7 K/uL   Basophils Relative 0  0 - 1 %   Basophils Absolute 0.0  0.0 - 0.1 K/uL  BASIC METABOLIC PANEL      Result Value Range   Sodium 136  135 - 145 mEq/L   Potassium 3.6  3.5 - 5.1 mEq/L   Chloride 101  96 - 112 mEq/L   CO2 23  19 - 32 mEq/L   Glucose, Bld 97  70 - 99 mg/dL   BUN 8  6 - 23 mg/dL   Creatinine, Ser 0.86  0.50 - 1.10 mg/dL   Calcium 9.1  8.4 - 57.8 mg/dL   GFR calc non Af Amer >90  >90 mL/min   GFR calc Af Amer >90  >90 mL/min  RPR      Result Value Range   RPR NON REACTIVE  NON REACTIVE  HIV ANTIBODY (ROUTINE TESTING)      Result Value Range   HIV NON REACTIVE  NON REACTIVE   [place] Art/Ven Flow Abd Pelv Doppler (Final result)  Result time: 06/18/13 22:43:34    Final result by Rad Results In Interface (06/18/13 22:43:34)    Narrative:   *RADIOLOGY REPORT*  Clinical Data: Evaluate for ovarian torsion. Left lower quadrant severe pain. LMP 06/13/2013  TRANSABDOMINAL AND TRANSVAGINAL  ULTRASOUND OF PELVIS DOPPLER ULTRASOUND OF OVARIES  Technique: Both transabdominal and transvaginal ultrasound examinations of the pelvis were performed. Transabdominal technique was performed for global imaging of the pelvis including uterus, ovaries, adnexal regions, and pelvic cul-de-sac.  It was necessary to proceed with endovaginal exam following the transabdominal exam to visualize the ovaries and adnexa.  Color and duplex Doppler ultrasound was utilized to evaluate blood flow to the ovaries.  Comparison: CT abdomen pelvis 03/10/2013  FINDINGS  Uterus: The uterus measures 12 x 3.8 x 5 cm and is diffusely inhomogeneous with linear areas of shadowing and an indistinct junctional zone. Findings raise the possibility of adenomyosis. No focal uterine mass is identified.  Endometrium: Normal in size and appearance. Measures 2 mm.  Right ovary: A definite right ovary is not visualized. No right adnexal mass is seen.  Left ovary: In the left paramidline pelvis is a probable ovary measuring 1.9 x 2.7 x 3.0 cm, containing a dominant follicle. Color Doppler flow and venous and arterial waveforms are seen within this probable left ovary. Bowel gas and stool noted in the left adnexa. No suspicious left adnexal mass.  Small amount of free pelvic fluid is present.  IMPRESSION:  1. A definite right ovary is not visualized. No right adnexal mass is seen. 2. Probable left ovary near the midline of the pelvis. No ovarian or left adnexal mass is seen. There is no evidence of left ovarian torsion. 3. 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   Original Report Authenticated By: Britta Mccreedy, M.D.     MDM   1. Abdominal pain   2. Bacterial vaginosis     [name] is a 47 y.o. female with a PMH of hepatitis B and sickle cell trait who presents to the ED for evaluation of abdominal pain.  Labs including CBC, BMP, UA, urine pregnancy ordered.     Rechecks  8:30 PM = Pelvic exam  performed at bedside with staff present.  Wet mount sent as well as GC testing.  Patient had CMT as well as left adnexal tenderness.  Moderate amount of thin white discharge present in vaginal vault.  Will tx for possible PID with rocephin IM and azithromycin.  Pelvic US ordered to further evaluate pain.  RPR and HIV will also be tested.   11:30 PM = Pain still now well controlled.  Repeat abdominal exam reveals LLQ tenderness.  CT abdomen and pelvis ordered.  1 mg dilaudid ordered.   12:45 AM = Patient states she cannot wait any longer.  Wants to sign out AMA.  I informed her of these risks including death and disability.  Patient verbally acknowledged this.  Patient will be tx with flagyl for BV.  Strict return precautions given.  Patient calling for ride home.     Etiology of abdominal pain is possibly due to PID.  She also likely has BV.  Patient left AMA and did not want to wait for CT scan.  Patient had LLQ tenderness on exam.  Her [place] was did not confirm her dx and I felt further evaluation was necessary given the level of pain and discomfort as well as leukocytosis.  She was tx for PID with rocephin and azithromycin in the ED.  GC results pending.  She did not have evidence of a UTI.  Patient was prescribed Flagyl for outpatient management of BV.  Patient was instructed to return to the ED if they experience any worsening abdominal pain, fever, repeated emesis, or other concerns.  Patient was in agreement with discharge and plan.  She was instructed to follow-up with her PCP and OB/GYN as soon as possible next week.  Obtaining a ride home.       Final impressions: 1. Abdominal pain  2. Bacterial vaginosis     Luiz Iron PA-C   This patient was discussed with Dr. Geralyn Flash, PA-C 06/19/13 [phone]

## 2013-06-18 NOTE — ED Notes (Signed)
Pt seen walking from outside w/o assistance. Pt informed that she is not allowed to leave with IV in and that this is a non-smoking facility.

## 2013-06-19 ENCOUNTER — Emergency Department (HOSPITAL_COMMUNITY): Payer: Self-pay

## 2013-06-19 LAB — GC/CHLAMYDIA PROBE AMP: CT Probe RNA: NEGATIVE

## 2013-06-19 LAB — RPR: RPR Ser Ql: NONREACTIVE

## 2013-06-19 LAB — HIV ANTIBODY (ROUTINE TESTING W REFLEX): HIV: NONREACTIVE

## 2013-06-19 MED ORDER — METRONIDAZOLE 500 MG PO TABS: 500.0000 mg | ORAL_TABLET | Freq: Two times a day (BID) | ORAL | Status: DC

## 2013-06-19 MED ORDER — IOHEXOL 300 MG/ML  SOLN: 50.0000 mL | Freq: Once | INTRAMUSCULAR | Status: DC | PRN

## 2013-06-19 NOTE — ED Provider Notes (Signed)
Medical screening examination/treatment/procedure(s) were conducted as a shared visit with non-physician practitioner(s) and myself.  I personally evaluated the patient during the encounter.   Patient presents with lower abdominal and pelvic pain. No abdominal tenderness is predominantly left lower quadrant. Differential diagnosis was gynecologic origin such as PID, ovarian cyst versus diverticulitis. Ultrasound did not show any acute abnormalities. She did have cervical motion tenderness. Repeat examination revealed increased tenderness on the left side and therefore she was scheduled for CAT scan but ultimately left AGAINST MEDICAL ADVICE. She was treated for PID which was the most likely diagnosis.  Gilda Crease, MD 06/19/13 [phone]

## 2013-07-09 ENCOUNTER — Emergency Department (HOSPITAL_COMMUNITY)
Admission: EM | Admit: 2013-07-09 | Discharge: 2013-07-09 | Disposition: A | Discharge status: Home / Self Care | Payer: No Typology Code available for payment source | Attending: Emergency Medicine | Admitting: Emergency Medicine

## 2013-07-09 ENCOUNTER — Emergency Department (HOSPITAL_COMMUNITY): Payer: No Typology Code available for payment source

## 2013-07-09 ENCOUNTER — Encounter (HOSPITAL_COMMUNITY): Payer: Self-pay | Admitting: Emergency Medicine

## 2013-07-09 DIAGNOSIS — S62308A Unspecified fracture of other metacarpal bone, initial encounter for closed fracture: Secondary | ICD-10-CM

## 2013-07-09 DIAGNOSIS — Z888 Allergy status to other drugs, medicaments and biological substances status: Secondary | ICD-10-CM | POA: Insufficient documentation

## 2013-07-09 DIAGNOSIS — F17200 Nicotine dependence, unspecified, uncomplicated: Secondary | ICD-10-CM | POA: Insufficient documentation

## 2013-07-09 DIAGNOSIS — Z8719 Personal history of other diseases of the digestive system: Secondary | ICD-10-CM | POA: Insufficient documentation

## 2013-07-09 DIAGNOSIS — Z79899 Other long term (current) drug therapy: Secondary | ICD-10-CM | POA: Insufficient documentation

## 2013-07-09 DIAGNOSIS — S7010XA Contusion of unspecified thigh, initial encounter: Secondary | ICD-10-CM | POA: Insufficient documentation

## 2013-07-09 DIAGNOSIS — S62319A Displaced fracture of base of unspecified metacarpal bone, initial encounter for closed fracture: Secondary | ICD-10-CM | POA: Insufficient documentation

## 2013-07-09 DIAGNOSIS — D573 Sickle-cell trait: Secondary | ICD-10-CM | POA: Insufficient documentation

## 2013-07-09 MED ORDER — OXYCODONE HCL 5 MG PO TABS
10.0000 mg | ORAL_TABLET | Freq: Once | ORAL | Status: AC
  Administered 2013-07-09: 10 mg via ORAL
  Filled 2013-07-09: qty 2

## 2013-07-09 MED ORDER — OXYCODONE HCL 5 MG PO TABS: 5.0000 mg | ORAL_TABLET | ORAL | Status: DC | PRN

## 2013-07-09 MED ORDER — OXYCODONE HCL 5 MG PO TABS
5.0000 mg | ORAL_TABLET | Freq: Once | ORAL | Status: DC
  Filled 2013-07-09: qty 1
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
## 2013-07-09 NOTE — Anesthesia Preprocedure Evaluation (Addendum)
Anesthesia Evaluation  Patient identified by MRN, date of birth, ID band Patient awake    Reviewed: Allergy & Precautions, H&P , NPO status , Patient's Chart, lab work & pertinent test results, reviewed documented beta blocker date and time   Airway Mallampati: I TM Distance: >3 FB Neck ROM: Full    Dental  (+) Teeth Intact and Dental Advisory Given   Pulmonary          Cardiovascular     Neuro/Psych    GI/Hepatic (+) Hepatitis -, BHx of Hepatitis B 2008   Endo/Other    Renal/GU Hx ARF 2008.  Creatinine nl now.     Musculoskeletal   Abdominal   Peds  Hematology   Anesthesia Other Findings   Reproductive/Obstetrics                           Anesthesia Physical Anesthesia Plan  ASA: II  Anesthesia Plan: General   Post-op Pain Management:    Induction: Intravenous  Airway Management Planned: LMA  Additional Equipment:   Intra-op Plan:   Post-operative Plan: Extubation in OR  Informed Consent: I have reviewed the patients History and Physical, chart, labs and discussed the procedure including the risks, benefits and alternatives for the proposed anesthesia with the patient or authorized representative who has indicated his/her understanding and acceptance.     Plan Discussed with: CRNA and Surgeon  Anesthesia Plan Comments:         Anesthesia Quick Evaluation

## 2013-07-09 NOTE — ED Notes (Signed)
Ortho complete

## 2013-07-09 NOTE — ED Notes (Signed)
Spoke with Ortho who will apply splint.  

## 2013-07-09 NOTE — ED Notes (Signed)
Paged Ortho  

## 2013-07-09 NOTE — ED Notes (Signed)
Ortho at bedside.

## 2013-07-09 NOTE — ED Notes (Signed)
Consult for Hand specialist at bedside
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
## 2013-07-09 NOTE — Progress Notes (Signed)
Orthopedic Tech Progress Note Patient Details:  [name] [birth year] [id number]  Ortho Devices Type of Ortho Device: Ace wrap;Volar splint;Arm sling Ortho Device/Splint Location: rue Ortho Device/Splint Interventions: Application   Nikki Dom 07/09/2013, 6:54 PM
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
## 2013-07-10 ENCOUNTER — Encounter (HOSPITAL_COMMUNITY): Payer: No Typology Code available for payment source | Admitting: Anesthesiology

## 2013-07-10 ENCOUNTER — Ambulatory Visit (HOSPITAL_COMMUNITY): Payer: No Typology Code available for payment source | Admitting: Anesthesiology

## 2013-07-10 ENCOUNTER — Inpatient Hospital Stay: Admit: 2013-07-10 | Payer: Self-pay | Admitting: Orthopedic Surgery

## 2013-07-10 ENCOUNTER — Ambulatory Visit (HOSPITAL_COMMUNITY)
Admission: RE | Admit: 2013-07-10 | Discharge: 2013-07-10 | Disposition: A | Discharge status: Home / Self Care | Payer: No Typology Code available for payment source | Source: Ambulatory Visit | Attending: Orthopedic Surgery | Admitting: Orthopedic Surgery

## 2013-07-10 ENCOUNTER — Encounter (HOSPITAL_COMMUNITY)
Admission: RE | Disposition: A | Discharge status: Home / Self Care | Payer: Self-pay | Source: Ambulatory Visit | Attending: Orthopedic Surgery

## 2013-07-10 DIAGNOSIS — S62309A Unspecified fracture of unspecified metacarpal bone, initial encounter for closed fracture: Secondary | ICD-10-CM | POA: Insufficient documentation

## 2013-07-10 DIAGNOSIS — IMO0002 Reserved for concepts with insufficient information to code with codable children: Secondary | ICD-10-CM | POA: Insufficient documentation

## 2013-07-10 HISTORY — PX: OPEN REDUCTION INTERNAL FIXATION (ORIF) METACARPAL: SHX6234

## 2013-07-10 SURGERY — OPEN REDUCTION INTERNAL FIXATION (ORIF) METACARPAL
Anesthesia: General | Site: Hand | Laterality: Right | Wound class: Clean

## 2013-07-10 MED ORDER — OXYCODONE HCL 5 MG PO TABS
5.0000 mg | ORAL_TABLET | Freq: Once | ORAL | Status: AC | PRN
  Administered 2013-07-10: 5 mg via ORAL

## 2013-07-10 MED ORDER — CEFAZOLIN SODIUM-DEXTROSE 2-3 GM-% IV SOLR
2.0000 g | Freq: Once | INTRAVENOUS | Status: AC
  Administered 2013-07-10: 2 g via INTRAVENOUS

## 2013-07-10 MED ORDER — HYDROMORPHONE HCL PF 1 MG/ML IJ SOLN
0.2500 mg | INTRAMUSCULAR | Status: DC | PRN
  Administered 2013-07-10 (×3): 0.5 mg via INTRAVENOUS

## 2013-07-10 MED ORDER — OXYCODONE HCL 5 MG/5ML PO SOLN: 5.0000 mg | Freq: Once | ORAL | Status: AC | PRN

## 2013-07-10 MED ORDER — LACTATED RINGERS IV SOLN
INTRAVENOUS | Status: DC | PRN
  Administered 2013-07-10 (×2): via INTRAVENOUS

## 2013-07-10 MED ORDER — DEXTROSE 5 % IV SOLN
INTRAVENOUS | Status: DC | PRN
  Administered 2013-07-10: 10:00:00 via INTRAVENOUS

## 2013-07-10 MED ORDER — OXYCODONE HCL 5 MG PO TABS
ORAL_TABLET | ORAL | Status: AC
  Administered 2013-07-10: 5 mg via ORAL
  Filled 2013-07-10: qty 1

## 2013-07-10 MED ORDER — HYDROMORPHONE HCL PF 1 MG/ML IJ SOLN
INTRAMUSCULAR | Status: AC
  Filled 2013-07-10: qty 1

## 2013-07-10 MED ORDER — PROPOFOL 10 MG/ML IV BOLUS
INTRAVENOUS | Status: DC | PRN
  Administered 2013-07-10: 160 mg via INTRAVENOUS

## 2013-07-10 MED ORDER — BACITRACIN-NEOMYCIN-POLYMYXIN 400-5-5000 EX OINT
TOPICAL_OINTMENT | CUTANEOUS | Status: AC
  Filled 2013-07-10: qty 1

## 2013-07-10 MED ORDER — ONDANSETRON HCL 4 MG/2ML IJ SOLN
INTRAMUSCULAR | Status: DC | PRN
  Administered 2013-07-10: 4 mg via INTRAMUSCULAR

## 2013-07-10 MED ORDER — BUPIVACAINE HCL (PF) 0.25 % IJ SOLN
INTRAMUSCULAR | Status: DC | PRN
  Administered 2013-07-10: 10 mL

## 2013-07-10 MED ORDER — MEPERIDINE HCL 25 MG/ML IJ SOLN: 6.2500 mg | INTRAMUSCULAR | Status: DC | PRN

## 2013-07-10 MED ORDER — OXYCODONE HCL 5 MG PO CAPS: 10.0000 mg | ORAL_CAPSULE | ORAL | Status: DC | PRN

## 2013-07-10 MED ORDER — BUPIVACAINE HCL (PF) 0.25 % IJ SOLN
INTRAMUSCULAR | Status: AC
  Filled 2013-07-10: qty 30

## 2013-07-10 MED ORDER — FENTANYL CITRATE 0.05 MG/ML IJ SOLN
INTRAMUSCULAR | Status: DC | PRN
  Administered 2013-07-10 (×2): 100 ug via INTRAVENOUS
  Administered 2013-07-10: 50 ug via INTRAVENOUS

## 2013-07-10 MED ORDER — ONDANSETRON HCL 4 MG/2ML IJ SOLN: 4.0000 mg | Freq: Once | INTRAMUSCULAR | Status: DC | PRN

## 2013-07-10 MED ORDER — LIDOCAINE HCL (CARDIAC) 20 MG/ML IV SOLN
INTRAVENOUS | Status: DC | PRN
  Administered 2013-07-10: 100 mg via INTRAVENOUS

## 2013-07-10 MED ORDER — 0.9 % SODIUM CHLORIDE (POUR BTL) OPTIME
TOPICAL | Status: DC | PRN
  Administered 2013-07-10: 1000 mL

## 2013-07-10 MED ORDER — CEFAZOLIN SODIUM 1-5 GM-% IV SOLN
INTRAVENOUS | Status: AC
  Filled 2013-07-10: qty 100

## 2013-07-10 MED ORDER — CEPHALEXIN 500 MG PO CAPS: 500.0000 mg | ORAL_CAPSULE | Freq: Four times a day (QID) | ORAL | Status: DC

## 2013-07-10 MED ORDER — MIDAZOLAM HCL 5 MG/5ML IJ SOLN
INTRAMUSCULAR | Status: DC | PRN
  Administered 2013-07-10 (×2): 1 mg via INTRAVENOUS

## 2013-07-10 SURGICAL SUPPLY — 35 items
BANDAGE ELASTIC 4 VELCRO ST LF (GAUZE/BANDAGES/DRESSINGS) ×4 IMPLANT
BANDAGE GAUZE 4  KLING STR (GAUZE/BANDAGES/DRESSINGS) ×2 IMPLANT
CORDS BIPOLAR (ELECTRODE) ×2 IMPLANT
COVER SURGICAL LIGHT HANDLE (MISCELLANEOUS) ×2 IMPLANT
CUFF TOURNIQUET SINGLE 18IN (TOURNIQUET CUFF) ×2 IMPLANT
DRAPE OEC MINIVIEW 54X84 (DRAPES) ×2 IMPLANT
DRAPE SURG 17X11 SM STRL (DRAPES) ×2 IMPLANT
DRSG ADAPTIC 3X8 NADH LF (GAUZE/BANDAGES/DRESSINGS) ×2 IMPLANT
ELECT REM PT RETURN 9FT ADLT (ELECTROSURGICAL) ×2
ELECTRODE REM PT RTRN 9FT ADLT (ELECTROSURGICAL) ×1 IMPLANT
GAUZE XEROFORM 5X9 LF (GAUZE/BANDAGES/DRESSINGS) ×2 IMPLANT
GOWN PREVENTION PLUS XLARGE (GOWN DISPOSABLE) ×4 IMPLANT
GOWN STRL NON-REIN LRG LVL3 (GOWN DISPOSABLE) ×2 IMPLANT
KIT BASIN OR (CUSTOM PROCEDURE TRAY) ×2 IMPLANT
KIT ROOM TURNOVER OR (KITS) ×2 IMPLANT
MANIFOLD NEPTUNE II (INSTRUMENTS) IMPLANT
NS IRRIG 1000ML POUR BTL (IV SOLUTION) ×2 IMPLANT
PACK ORTHO EXTREMITY (CUSTOM PROCEDURE TRAY) ×2 IMPLANT
PAD ARMBOARD 7.5X6 YLW CONV (MISCELLANEOUS) ×2 IMPLANT
PAD CAST 4YDX4 CTTN HI CHSV (CAST SUPPLIES) ×2 IMPLANT
PADDING CAST COTTON 4X4 STRL (CAST SUPPLIES) ×2
PLATE LOCK 2.5MM Y-SHAPE (Plate) ×2 IMPLANT
SCREW MDTP 2.5X13MM (Screw) ×2 IMPLANT
SCREW PEG 11MM (Screw) ×2 IMPLANT
SCREW PEG LOCK 2.5X10 (Peg) ×6 IMPLANT
SPLINT FIBERGLASS 4X30 (CAST SUPPLIES) ×2 IMPLANT
SPONGE GAUZE 4X4 12PLY (GAUZE/BANDAGES/DRESSINGS) ×2 IMPLANT
SUCTION FRAZIER TIP 10 FR DISP (SUCTIONS) ×2 IMPLANT
SUT PROLENE 3 0 PS 2 (SUTURE) ×2 IMPLANT
SUT VIC AB 4-0 PS2 27 (SUTURE) ×2 IMPLANT
TOWEL OR 17X24 6PK STRL BLUE (TOWEL DISPOSABLE) ×2 IMPLANT
TOWEL OR 17X26 10 PK STRL BLUE (TOWEL DISPOSABLE) ×2 IMPLANT
TUBE CONNECTING 12X1/4 (SUCTIONS) ×2 IMPLANT
WATER STERILE IRR 1000ML POUR (IV SOLUTION) ×2 IMPLANT
YANKAUER SUCT BULB TIP NO VENT (SUCTIONS) ×2 IMPLANT

## 2013-07-10 NOTE — Transfer of Care (Signed)
Immediate Anesthesia Transfer of Care Note  Patient: [name]  Procedure(s) Performed: Procedure(s): OPEN REDUCTION INTERNAL FIXATION (ORIF) METACARPAL RIGHT SMALL FINGER  (Right)  Patient Location: PACU  Anesthesia Type:General  Level of Consciousness: awake, alert , oriented and patient cooperative  Airway & Oxygen Therapy: Patient Spontanous Breathing and Patient connected to nasal cannula oxygen  Post-op Assessment: Report given to PACU RN, Post -op Vital signs reviewed and stable and Patient moving all extremities  Post vital signs: Reviewed and stable  Complications: No apparent anesthesia complications

## 2013-07-10 NOTE — H&P (Signed)
  Please see consult 07/09/13  Plan for ORIF right 5th [facility] fx  Patient ready and desire to proceed  Dominica Severin MD

## 2013-07-10 NOTE — ED Provider Notes (Signed)
Medical screening examination/treatment/procedure(s) were performed by non-physician practitioner and as supervising physician I was immediately available for consultation/collaboration.  Lakely Elmendorf L Kendy Haston, MD 07/10/13 0040 

## 2013-07-10 NOTE — Anesthesia Postprocedure Evaluation (Signed)
Anesthesia Post Note  Patient: [name]  Procedure(s) Performed: Procedure(s) (LRB): OPEN REDUCTION INTERNAL FIXATION (ORIF) METACARPAL RIGHT SMALL FINGER  (Right)  Anesthesia type: general  Patient location: PACU  Post pain: Pain level controlled  Post assessment: Patient's Cardiovascular Status Stable  Last Vitals:  Filed Vitals:   07/10/13 1230  BP:   Pulse: 81  Temp:   Resp: 13    Post vital signs: Reviewed and stable  Level of consciousness: sedated  Complications: No apparent anesthesia complications

## 2013-07-10 NOTE — Preoperative (Signed)
Beta Blockers   Reason not to administer Beta Blockers:Not Applicable 

## 2013-07-10 NOTE — Progress Notes (Signed)
Unable to document  Waste of narcotic in pyxis. Dilaudid 0.5mg  iv wasted by Lester Kinsman Rn , witnessed by Spero Curb RN

## 2013-07-10 NOTE — Op Note (Signed)
See Dictation#[id number] Dominica Severin MD

## 2013-07-10 NOTE — Anesthesia Procedure Notes (Signed)
Procedure Name: LMA Insertion Date/Time: 07/10/2013 9:42 AM Performed by: Darcey Nora B Pre-anesthesia Checklist: Patient identified, Patient being monitored, Emergency Drugs available and Suction available Patient Re-evaluated:Patient Re-evaluated prior to inductionOxygen Delivery Method: Circle system utilized Preoxygenation: Pre-oxygenation with 100% oxygen Intubation Type: IV induction Ventilation: Mask ventilation without difficulty LMA: LMA inserted LMA Size: 4.0 Number of attempts: 1 Placement Confirmation: breath sounds checked- equal and bilateral and positive ETCO2 Tube secured with: Tape (taped across cheeks; gauze roll b/t teeth) Dental Injury: Teeth and Oropharynx as per pre-operative assessment
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
## 2013-07-14 ENCOUNTER — Encounter (HOSPITAL_COMMUNITY): Payer: Self-pay | Admitting: Orthopedic Surgery

## 2013-10-04 ENCOUNTER — Ambulatory Visit: Payer: No Typology Code available for payment source | Attending: Orthopedic Surgery | Admitting: Occupational Therapy

## 2013-10-12 ENCOUNTER — Ambulatory Visit: Payer: No Typology Code available for payment source | Admitting: Occupational Therapy

## 2013-11-24 ENCOUNTER — Encounter (HOSPITAL_COMMUNITY): Payer: Self-pay | Admitting: Emergency Medicine

## 2013-11-24 ENCOUNTER — Emergency Department (HOSPITAL_COMMUNITY)
Admission: EM | Admit: 2013-11-24 | Discharge: 2013-11-24 | Disposition: A | Discharge status: Home / Self Care | Payer: No Typology Code available for payment source | Attending: Emergency Medicine | Admitting: Emergency Medicine

## 2013-11-24 ENCOUNTER — Emergency Department (HOSPITAL_COMMUNITY): Payer: No Typology Code available for payment source

## 2013-11-24 DIAGNOSIS — F411 Generalized anxiety disorder: Secondary | ICD-10-CM | POA: Insufficient documentation

## 2013-11-24 DIAGNOSIS — W010XXA Fall on same level from slipping, tripping and stumbling without subsequent striking against object, initial encounter: Secondary | ICD-10-CM | POA: Insufficient documentation

## 2013-11-24 DIAGNOSIS — M25559 Pain in unspecified hip: Secondary | ICD-10-CM

## 2013-11-24 DIAGNOSIS — Z792 Long term (current) use of antibiotics: Secondary | ICD-10-CM | POA: Insufficient documentation

## 2013-11-24 DIAGNOSIS — S6990XA Unspecified injury of unspecified wrist, hand and finger(s), initial encounter: Secondary | ICD-10-CM | POA: Insufficient documentation

## 2013-11-24 DIAGNOSIS — Y9389 Activity, other specified: Secondary | ICD-10-CM | POA: Insufficient documentation

## 2013-11-24 DIAGNOSIS — S59919A Unspecified injury of unspecified forearm, initial encounter: Secondary | ICD-10-CM

## 2013-11-24 DIAGNOSIS — M25539 Pain in unspecified wrist: Secondary | ICD-10-CM

## 2013-11-24 DIAGNOSIS — Z862 Personal history of diseases of the blood and blood-forming organs and certain disorders involving the immune mechanism: Secondary | ICD-10-CM | POA: Insufficient documentation

## 2013-11-24 DIAGNOSIS — S79919A Unspecified injury of unspecified hip, initial encounter: Secondary | ICD-10-CM | POA: Insufficient documentation

## 2013-11-24 DIAGNOSIS — IMO0002 Reserved for concepts with insufficient information to code with codable children: Secondary | ICD-10-CM | POA: Insufficient documentation

## 2013-11-24 DIAGNOSIS — Z79899 Other long term (current) drug therapy: Secondary | ICD-10-CM | POA: Insufficient documentation

## 2013-11-24 DIAGNOSIS — M549 Dorsalgia, unspecified: Secondary | ICD-10-CM

## 2013-11-24 DIAGNOSIS — F17200 Nicotine dependence, unspecified, uncomplicated: Secondary | ICD-10-CM | POA: Insufficient documentation

## 2013-11-24 DIAGNOSIS — S59909A Unspecified injury of unspecified elbow, initial encounter: Secondary | ICD-10-CM | POA: Insufficient documentation

## 2013-11-24 DIAGNOSIS — W19XXXA Unspecified fall, initial encounter: Secondary | ICD-10-CM

## 2013-11-24 DIAGNOSIS — S79929A Unspecified injury of unspecified thigh, initial encounter: Secondary | ICD-10-CM

## 2013-11-24 DIAGNOSIS — Z8619 Personal history of other infectious and parasitic diseases: Secondary | ICD-10-CM | POA: Insufficient documentation

## 2013-11-24 DIAGNOSIS — Y9289 Other specified places as the place of occurrence of the external cause: Secondary | ICD-10-CM | POA: Insufficient documentation

## 2013-11-24 MED ORDER — PREDNISONE 20 MG PO TABS: 40.0000 mg | ORAL_TABLET | Freq: Every day | ORAL | Status: DC

## 2013-11-24 MED ORDER — NAPROXEN 500 MG PO TABS: 500.0000 mg | ORAL_TABLET | Freq: Two times a day (BID) | ORAL | Status: DC

## 2013-11-24 MED ORDER — IBUPROFEN 800 MG PO TABS
800.0000 mg | ORAL_TABLET | Freq: Once | ORAL | Status: AC
  Administered 2013-11-24: 800 mg via ORAL
  Filled 2013-11-24: qty 1

## 2013-11-24 MED ORDER — TRAMADOL HCL 50 MG PO TABS
50.0000 mg | ORAL_TABLET | Freq: Once | ORAL | Status: AC
  Administered 2013-11-24: 50 mg via ORAL
  Filled 2013-11-24: qty 1

## 2013-11-24 NOTE — ED Notes (Signed)
Pt c/o mid and lower back pain and R side pain after slipping and falling on ice.  Pt reports that she fell and her back hit the edge of a step.  Pain score 10/10.  Denies numbness and tingling.

## 2013-11-24 NOTE — ED Provider Notes (Signed)
CSN: [id number]     Arrival date & time 11/24/13  1851 History   First MD Initiated Contact with Patient 11/24/13 1912     Chief Complaint  Patient presents with  . Back Pain     (Consider location/radiation/quality/duration/timing/severity/associated sxs/prior Treatment) The history is provided by the patient and medical records.   This is a 48 year old black female with PMH of Hepatitis B, Sickle cell trait, s/p right wrist surgery presents with chief complaint of back pain. Patient states that she was cleaning up at the hair salon when she slept and fell backwards on the ice, impacting her back on the edge of a step. States that she fell on her right side injuring her right hip and right wrist. Denies head trauma or LOC. She describes sharp pain radiating down her right side.  Denies numbness or paresthesias.  No loss of bowel or bladder control.  VS stable on arrival.  Past Medical History  Diagnosis Date  . Hepatitis B   . Sickle cell trait    Past Surgical History  Procedure Laterality Date  . Wisdom tooth extraction    . Incision and drainage intra oral abscess    . Open reduction internal fixation (orif) metacarpal Right 07/10/2013    Procedure: OPEN REDUCTION INTERNAL FIXATION (ORIF) METACARPAL RIGHT SMALL FINGER ;  Surgeon: Dominica Severin, MD;  Location: [facility] OR;  Service: Orthopedics;  Laterality: Right;  . Tubal ligation     Family History  Problem Relation Age of Onset  . Cancer Other   . Diabetes Other    History  Substance Use Topics  . Smoking status: Current Every Day Smoker -- 0.50 packs/day  . Smokeless tobacco: Not on file  . Alcohol Use: No   OB History   Grav Para Term Preterm Abortions TAB SAB Ect Mult Living                 Review of Systems  Musculoskeletal: Positive for arthralgias and back pain.  All other systems reviewed and are negative.      Allergies  Acetaminophen and Peroxide  Home Medications   Current Outpatient Rx  Name   Route  Sig  Dispense  Refill  . Biotin 1000 MCG tablet   Oral   Take 1,000 mcg by mouth daily.         . cephALEXin (KEFLEX) 500 MG capsule   Oral   Take 1 capsule (500 mg total) by mouth 4 (four) times daily.   28 capsule   0   . fish oil-omega-3 fatty acids 1000 MG capsule   Oral   Take 1 g by mouth daily.         Marland Kitchen ibuprofen (ADVIL,MOTRIN) 200 MG tablet   Oral   Take 400 mg by mouth every 6 (six) hours as needed for pain.         . metroNIDAZOLE (FLAGYL) 500 MG tablet   Oral   Take 1 tablet (500 mg total) by mouth 2 (two) times daily.   14 tablet   0   . Milk Thistle 1000 MG CAPS   Oral   Take 2 capsules by mouth 3 (three) times daily with meals.         . Multiple Vitamin (MULTIVITAMIN WITH MINERALS) TABS   Oral   Take 1 tablet by mouth daily.         Marland Kitchen oxyCODONE (OXY IR/ROXICODONE) 5 MG immediate release tablet   Oral   Take 1  tablet (5 mg total) by mouth every 4 (four) hours as needed for pain.   20 tablet   0   . oxycodone (OXY-IR) 5 MG capsule   Oral   Take 2 capsules (10 mg total) by mouth every 4 (four) hours as needed.   55 capsule   0   . vitamin B-12 (CYANOCOBALAMIN) 500 MCG tablet   Oral   Take 500 mcg by mouth daily.          BP 127/85  Pulse 91  Temp(Src) 97.8 F (36.6 C) (Oral)  Resp 20  SpO2 99%  Physical Exam  Nursing note and vitals reviewed. Constitutional: She is oriented to person, place, and time. She appears well-developed and well-nourished. No distress.  HENT:  Head: Normocephalic and atraumatic.  Neck: Normal range of motion. Neck supple.  Cardiovascular: Normal rate and regular rhythm.   Pulmonary/Chest: Effort normal and breath sounds normal. No respiratory distress. She has no wheezes.  Abdominal: Soft. Bowel sounds are normal. There is no tenderness. There is no guarding.  Musculoskeletal: She exhibits no edema.       Right wrist: She exhibits decreased range of motion, tenderness and bony tenderness. She  exhibits no swelling and no effusion.       Right hip: She exhibits tenderness and bony tenderness. She exhibits normal strength, no swelling, no deformity and no laceration.       Right knee: She exhibits normal range of motion, no swelling and no effusion. No tenderness found.       Left knee: She exhibits normal range of motion, no swelling and no effusion. No tenderness found.  TTP of right hip to even minimal palpation; ROM difficult to assess as pt screams before her leg has moved; distal sensation intact Right wrist with diffuse TTP; no swelling or deformities; strong radial pulse and cap refill; sensation intact  Neurological: She is alert and oriented to person, place, and time. She is not disoriented. No sensory deficit. She exhibits normal muscle tone.  Reflex Scores:      Patellar reflexes are 2+ on the right side and 2+ on the left side.      Achilles reflexes are 2+ on the right side and 2+ on the left side. Skin: Skin is warm and dry. She is not diaphoretic.  Psychiatric: Her mood appears anxious.  Anxious and tearful    ED Course  Procedures (including critical care time) Labs Review Labs Reviewed - No data to display Imaging Review Dg Lumbar Spine Complete  11/24/2013   CLINICAL DATA:  Fall.  Back pain.  EXAM: LUMBAR SPINE - COMPLETE 4+ VIEW  COMPARISON:  None.  FINDINGS: Vertebral body height and alignment are normal. Intervertebral disc space height is maintained. Facet joints are unremarkable. No pars interarticularis defect. Paraspinous structures are unremarkable.  IMPRESSION: Negative exam.   Electronically Signed   By: Drusilla Kanner M.D.   On: 11/24/2013 19:48   Dg Wrist Complete Right  11/24/2013   CLINICAL DATA:  Wrist pain after a fall  EXAM: RIGHT WRIST - COMPLETE 3+ VIEW  COMPARISON:  DG HAND COMPLETE*R* dated 07/09/2013; DG WRIST COMPLETE*R* dated 07/09/2013  FINDINGS: There is no evidence of fracture or dislocation. There is no evidence of arthropathy or  other focal bone abnormality. Soft tissues are unremarkable. Previous ORIF of fifth metacarpal fracture with healing.  IMPRESSION: Negative for wrist fracture.   Electronically Signed   By: Davonna Belling M.D.   On: 11/24/2013 19:47  Dg Hip Complete Right  11/24/2013   CLINICAL DATA:  Back pain.  EXAM: RIGHT HIP - COMPLETE 2+ VIEW  COMPARISON:  None.  FINDINGS: The hips are located. There is no fracture. No focal bony lesion is identified. No notable degenerative change. No plain film evidence of avascular necrosis.  IMPRESSION: Negative exam.   Electronically Signed   By: Drusilla Kannerhomas  Dalessio M.D.   On: 11/24/2013 19:47    EKG Interpretation   None       MDM   Final diagnoses:  Fall  Wrist pain  Back pain  Hip pain   On arrival pt was ambulating unassisted without difficulty.  She was directed to triage room by nurse and proceeded to throw herself into the floor.  She did not impact her head or lose consciousness.  This incident was witnessed by several staff members, myself included.  Pt was helped to chair and continued screaming in pain.  On exam, she has no visible signs of trauma or deformities.  X-rays negative for acute fractures.  Pt given new wrist splint.  Rx naprosyn and prednisone.  FU with PCP.  Discussed plan with pt, she agreed.  Return precautions advised.  Garlon HatchetLisa M Sanders, PA-C 11/24/13 2008

## 2013-11-24 NOTE — ED Notes (Signed)
Prior to Triage, after being directed into Triage room, Pt threw herself onto the floor.  Multiple staff members witnessed the event.  Pt ambulated into department and into the Triage area w/ very little difficulty.

## 2013-11-24 NOTE — Discharge Instructions (Signed)
Take the prescribed medication as directed.  May apply heat to affected areas to help with pain. Follow-up with your primary care physician. Return to the ED for new or worsening symptoms.

## 2013-11-28 NOTE — ED Provider Notes (Signed)
Medical screening examination/treatment/procedure(s) were performed by non-physician practitioner and as supervising physician I was immediately available for consultation/collaboration.   EKG Interpretation None       Raeford RazorStephen Caryle Helgeson, MD 11/28/13 1434

## 2013-12-14 ENCOUNTER — Encounter (HOSPITAL_COMMUNITY): Payer: Self-pay

## 2013-12-16 ENCOUNTER — Other Ambulatory Visit: Payer: Self-pay | Admitting: Orthopedic Surgery

## 2013-12-20 ENCOUNTER — Encounter (HOSPITAL_COMMUNITY): Payer: Self-pay

## 2013-12-20 ENCOUNTER — Encounter (HOSPITAL_COMMUNITY)
Admission: RE | Admit: 2013-12-20 | Discharge: 2013-12-20 | Disposition: A | Discharge status: Home / Self Care | Payer: No Typology Code available for payment source | Source: Ambulatory Visit | Attending: Orthopedic Surgery | Admitting: Orthopedic Surgery

## 2013-12-20 DIAGNOSIS — Z01812 Encounter for preprocedural laboratory examination: Secondary | ICD-10-CM | POA: Insufficient documentation

## 2013-12-20 HISTORY — DX: Personal history of other diseases of the digestive system: Z87.19

## 2013-12-20 HISTORY — DX: Personal history of other infectious and parasitic diseases: Z86.19

## 2013-12-20 LAB — COMPREHENSIVE METABOLIC PANEL
ALT: 15 U/L (ref 0–35)
AST: 20 U/L (ref 0–37)
Albumin: 3.8 g/dL (ref 3.5–5.2)
Alkaline Phosphatase: 55 U/L (ref 39–117)
BUN: 10 mg/dL (ref 6–23)
CALCIUM: 9.6 mg/dL (ref 8.4–10.5)
CO2: 22 mEq/L (ref 19–32)
Chloride: 103 mEq/L (ref 96–112)
Creatinine, Ser: 0.71 mg/dL (ref 0.50–1.10)
GFR calc non Af Amer: >90 mL/min (ref >90)
Glucose, Bld: 93 mg/dL (ref 70–99)
Potassium: 4.4 mEq/L (ref 3.7–5.3)
Sodium: 141 mEq/L (ref 137–147)
TOTAL PROTEIN: 7.5 g/dL (ref 6.0–8.3)
Total Bilirubin: <0.2 mg/dL — ABNORMAL LOW (ref 0.3–1.2)

## 2013-12-20 LAB — CBC
HEMATOCRIT: 38 % (ref 36.0–46.0)
HEMOGLOBIN: 13.6 g/dL (ref 12.0–15.0)
MCH: 30.1 pg (ref 26.0–34.0)
MCHC: 35.8 g/dL (ref 30.0–36.0)
MCV: 84.1 fL (ref 78.0–100.0)
Platelets: 299 10*3/uL (ref 150–400)
RBC: 4.52 MIL/uL (ref 3.87–5.11)
RDW: 13.8 % (ref 11.5–15.5)
WBC: 10.1 10*3/uL (ref 4.0–10.5)

## 2013-12-20 LAB — HCG, SERUM, QUALITATIVE: Preg, Serum: NEGATIVE

## 2013-12-20 NOTE — Pre-Procedure Instructions (Signed)
Joaquin CourtsKimberly Buddenhagen  12/20/2013   Your procedure is scheduled on:  March 26  [facility] [address] at [employer]1:45 PM.  Call this number if you have problems the morning of surgery: [phone]   Remember:   Do not eat food or drink liquids after midnight.   Take these medicines the morning of surgery with A SIP OF WATER: Oxycodone (if needed), Prednisone (if still on dose pack),   STOP ALL Biotin, Vitamin D, Fish Oil, Milk Thistle, Vitamin B12, Vitamin C, Vitamin E   STOP/ Do not take Aspirin, Aleve, Naproxen, Advil, Ibuprofen, Vitamin, Herbs, or Supplements starting today   Do not wear jewelry, make-up or nail polish.  Do not wear lotions, powders, or perfumes. You may wear deodorant.  Do not shave 48 hours prior to surgery. Men may shave face and neck.  Do not bring valuables to the hospital.  [facility] is not responsible for any belongings or valuables.               Contacts, dentures or bridgework may not be worn into surgery.  Leave suitcase in the car. After surgery it may be brought to your room.  For patients admitted to the hospital, discharge time is determined by your treatment team.               Patients discharged the day of surgery will not be allowed to drive home.  Name and phone number of your driver: Family/ Friend  Special Instructions: See [facility]   Please read over the following fact sheets that you were given: Pain Booklet, Coughing and Deep Breathing and Surgical Site Infection Prevention

## 2013-12-20 NOTE — Pre-Procedure Instructions (Signed)
[place] - Preparing for Surgery  Before surgery, you can play an important role.  Because skin is not sterile, your skin needs to be as free of germs as possible.  You can reduce the number of germs on you skin by washing with CHG (chlorahexidine gluconate) soap before surgery.  CHG is an antiseptic cleaner which kills germs and bonds with the skin to continue killing germs even after washing.  Please DO NOT use if you have an allergy to CHG or antibacterial soaps.  If your skin becomes reddened/irritated stop using the CHG and inform your nurse when you arrive at Short Stay.  Do not shave (including legs and underarms) for at least 48 hours prior to the first CHG shower.  You may shave your face.  Please follow these instructions carefully:   1.  Shower with CHG Soap the night before surgery and the morning of Surgery.  2.  If you choose to wash your hair, wash your hair first as usual with your normal shampoo.  3.  After you shampoo, rinse your hair and body thoroughly to remove the shampoo.  4.  Use CHG as you would any other liquid soap.  You can apply CHG directly to the skin and wash gently with scrungie or a clean washcloth.  5.  Apply the CHG Soap to your body ONLY FROM THE NECK DOWN.  Do not use on open wounds or open sores.  Avoid contact with your eyes, ears, mouth and genitals (private parts).  Wash genitals (private parts) with your normal soap.  6.  Wash thoroughly, paying special attention to the area where your surgery will be performed.  7.  Thoroughly rinse your body with warm water from the neck down.  8.  DO NOT shower/wash with your normal soap after using and rinsing off the CHG Soap.  9.  Pat yourself dry with a clean towel.            10.  Wear clean pajamas.            11.  Place clean sheets on your bed the night of your first shower and do not sleep with pets.  Day of Surgery  Do not apply any lotions the morning of surgery.  Please wear clean clothes to the  hospital/surgery center.   

## 2013-12-21 MED ORDER — CEFAZOLIN SODIUM-DEXTROSE 2-3 GM-% IV SOLR
2.0000 g | INTRAVENOUS | Status: AC
  Administered 2013-12-22: 2 g via INTRAVENOUS
  Filled 2013-12-21: qty 50

## 2013-12-21 MED ORDER — CHLORHEXIDINE GLUCONATE 4 % EX LIQD
60.0000 mL | Freq: Once | CUTANEOUS | Status: DC
  Filled 2013-12-21: qty 60

## 2013-12-21 MED ORDER — SODIUM CHLORIDE 0.45 % IV SOLN: INTRAVENOUS | Status: DC

## 2013-12-22 ENCOUNTER — Encounter (HOSPITAL_COMMUNITY): Payer: Self-pay | Admitting: Certified Registered Nurse Anesthetist

## 2013-12-22 ENCOUNTER — Encounter (HOSPITAL_COMMUNITY): Payer: Self-pay | Admitting: *Deleted

## 2013-12-22 ENCOUNTER — Encounter (HOSPITAL_COMMUNITY)
Admission: RE | Disposition: A | Discharge status: Home / Self Care | Payer: Self-pay | Source: Ambulatory Visit | Attending: Orthopedic Surgery

## 2013-12-22 ENCOUNTER — Ambulatory Visit (HOSPITAL_COMMUNITY)
Admission: RE | Admit: 2013-12-22 | Discharge: 2013-12-22 | Disposition: A | Discharge status: Home / Self Care | Payer: Self-pay | Source: Ambulatory Visit | Attending: Orthopedic Surgery | Admitting: Orthopedic Surgery

## 2013-12-22 ENCOUNTER — Ambulatory Visit (HOSPITAL_COMMUNITY): Payer: No Typology Code available for payment source | Admitting: Certified Registered Nurse Anesthetist

## 2013-12-22 DIAGNOSIS — T8489XA Other specified complication of internal orthopedic prosthetic devices, implants and grafts, initial encounter: Secondary | ICD-10-CM | POA: Insufficient documentation

## 2013-12-22 DIAGNOSIS — M6789 Other specified disorders of synovium and tendon, multiple sites: Secondary | ICD-10-CM | POA: Insufficient documentation

## 2013-12-22 DIAGNOSIS — Z8619 Personal history of other infectious and parasitic diseases: Secondary | ICD-10-CM | POA: Insufficient documentation

## 2013-12-22 DIAGNOSIS — IMO0002 Reserved for concepts with insufficient information to code with codable children: Secondary | ICD-10-CM | POA: Insufficient documentation

## 2013-12-22 DIAGNOSIS — D573 Sickle-cell trait: Secondary | ICD-10-CM | POA: Insufficient documentation

## 2013-12-22 DIAGNOSIS — F17200 Nicotine dependence, unspecified, uncomplicated: Secondary | ICD-10-CM | POA: Insufficient documentation

## 2013-12-22 DIAGNOSIS — Y831 Surgical operation with implant of artificial internal device as the cause of abnormal reaction of the patient, or of later complication, without mention of misadventure at the time of the procedure: Secondary | ICD-10-CM | POA: Insufficient documentation

## 2013-12-22 HISTORY — PX: HARDWARE REMOVAL: SHX979

## 2013-12-22 SURGERY — REMOVAL, HARDWARE
Anesthesia: General | Site: Hand | Laterality: Right

## 2013-12-22 MED ORDER — LIDOCAINE HCL (CARDIAC) 20 MG/ML IV SOLN
INTRAVENOUS | Status: DC | PRN
  Administered 2013-12-22: 80 mg via INTRAVENOUS

## 2013-12-22 MED ORDER — OXYCODONE HCL 5 MG/5ML PO SOLN: 5.0000 mg | Freq: Once | ORAL | Status: AC | PRN

## 2013-12-22 MED ORDER — MIDAZOLAM HCL 2 MG/2ML IJ SOLN
INTRAMUSCULAR | Status: AC
  Filled 2013-12-22: qty 2

## 2013-12-22 MED ORDER — FENTANYL CITRATE 0.05 MG/ML IJ SOLN
INTRAMUSCULAR | Status: DC | PRN
  Administered 2013-12-22 (×4): 25 ug via INTRAVENOUS
  Administered 2013-12-22: 100 ug via INTRAVENOUS
  Administered 2013-12-22 (×2): 25 ug via INTRAVENOUS

## 2013-12-22 MED ORDER — OXYCODONE HCL 5 MG PO TABS
5.0000 mg | ORAL_TABLET | Freq: Once | ORAL | Status: AC | PRN
  Administered 2013-12-22: 5 mg via ORAL

## 2013-12-22 MED ORDER — MIDAZOLAM HCL 5 MG/5ML IJ SOLN
INTRAMUSCULAR | Status: DC | PRN
  Administered 2013-12-22: 2 mg via INTRAVENOUS

## 2013-12-22 MED ORDER — ONDANSETRON HCL 4 MG/2ML IJ SOLN
INTRAMUSCULAR | Status: AC
  Filled 2013-12-22: qty 2

## 2013-12-22 MED ORDER — PROPOFOL 10 MG/ML IV BOLUS
INTRAVENOUS | Status: AC
  Filled 2013-12-22: qty 20

## 2013-12-22 MED ORDER — OXYCODONE HCL 5 MG PO CAPS: 5.0000 mg | ORAL_CAPSULE | ORAL | Status: DC | PRN

## 2013-12-22 MED ORDER — LACTATED RINGERS IV SOLN
INTRAVENOUS | Status: DC | PRN
  Administered 2013-12-22 (×2): via INTRAVENOUS

## 2013-12-22 MED ORDER — OXYCODONE HCL 5 MG PO TABS
ORAL_TABLET | ORAL | Status: AC
  Administered 2013-12-22: 5 mg via ORAL
  Filled 2013-12-22: qty 1

## 2013-12-22 MED ORDER — ONDANSETRON HCL 4 MG/2ML IJ SOLN
INTRAMUSCULAR | Status: DC | PRN
  Administered 2013-12-22: 4 mg via INTRAVENOUS

## 2013-12-22 MED ORDER — HYDROMORPHONE HCL PF 1 MG/ML IJ SOLN
0.2500 mg | INTRAMUSCULAR | Status: DC | PRN
  Administered 2013-12-22 (×2): 0.5 mg via INTRAVENOUS

## 2013-12-22 MED ORDER — LACTATED RINGERS IV SOLN
INTRAVENOUS | Status: DC
  Administered 2013-12-22: 14:00:00 via INTRAVENOUS

## 2013-12-22 MED ORDER — FENTANYL CITRATE 0.05 MG/ML IJ SOLN
INTRAMUSCULAR | Status: AC
  Filled 2013-12-22: qty 5

## 2013-12-22 MED ORDER — HYDROMORPHONE HCL PF 1 MG/ML IJ SOLN
INTRAMUSCULAR | Status: AC
  Administered 2013-12-22: 0.5 mg via INTRAVENOUS
  Filled 2013-12-22: qty 1

## 2013-12-22 MED ORDER — PROMETHAZINE HCL 25 MG/ML IJ SOLN: 6.2500 mg | INTRAMUSCULAR | Status: DC | PRN

## 2013-12-22 MED ORDER — BUPIVACAINE-EPINEPHRINE (PF) 0.5% -1:200000 IJ SOLN
INTRAMUSCULAR | Status: AC
  Filled 2013-12-22: qty 10

## 2013-12-22 MED ORDER — 0.9 % SODIUM CHLORIDE (POUR BTL) OPTIME
TOPICAL | Status: DC | PRN
  Administered 2013-12-22: 1000 mL

## 2013-12-22 MED ORDER — BUPIVACAINE-EPINEPHRINE PF 0.5-1:200000 % IJ SOLN
INTRAMUSCULAR | Status: DC | PRN
  Administered 2013-12-22: 10 mL

## 2013-12-22 MED ORDER — PROPOFOL 10 MG/ML IV BOLUS
INTRAVENOUS | Status: DC | PRN
  Administered 2013-12-22: 200 mg via INTRAVENOUS

## 2013-12-22 SURGICAL SUPPLY — 55 items
BANDAGE ELASTIC 3 VELCRO ST LF (GAUZE/BANDAGES/DRESSINGS) IMPLANT
BANDAGE ELASTIC 4 VELCRO ST LF (GAUZE/BANDAGES/DRESSINGS) IMPLANT
BANDAGE GAUZE ELAST BULKY 4 IN (GAUZE/BANDAGES/DRESSINGS) ×3 IMPLANT
BNDG COHESIVE 1X5 TAN STRL LF (GAUZE/BANDAGES/DRESSINGS) IMPLANT
BNDG ELASTIC 2 VLCR STRL LF (GAUZE/BANDAGES/DRESSINGS) IMPLANT
CANISTER SUCTION 2500CC (MISCELLANEOUS) ×3 IMPLANT
CAP PIN ORTHO PINK (CAP) IMPLANT
CAP PIN PROTECTOR ORTHO WHT (CAP) IMPLANT
CORDS BIPOLAR (ELECTRODE) ×3 IMPLANT
COVER SURGICAL LIGHT HANDLE (MISCELLANEOUS) ×3 IMPLANT
CUFF TOURNIQUET SINGLE 18IN (TOURNIQUET CUFF) ×3 IMPLANT
CUFF TOURNIQUET SINGLE 24IN (TOURNIQUET CUFF) IMPLANT
DRAPE OEC MINIVIEW 54X84 (DRAPES) IMPLANT
DRAPE SURG 17X23 STRL (DRAPES) ×3 IMPLANT
DRSG ADAPTIC 3X8 NADH LF (GAUZE/BANDAGES/DRESSINGS) ×3 IMPLANT
GAUZE SPONGE 2X2 8PLY STRL LF (GAUZE/BANDAGES/DRESSINGS) IMPLANT
GAUZE XEROFORM 1X8 LF (GAUZE/BANDAGES/DRESSINGS) IMPLANT
GAUZE XEROFORM 5X9 LF (GAUZE/BANDAGES/DRESSINGS) ×3 IMPLANT
GLOVE BIOGEL M STRL SZ7.5 (GLOVE) ×3 IMPLANT
GLOVE SS BIOGEL STRL SZ 8 (GLOVE) ×1 IMPLANT
GLOVE SUPERSENSE BIOGEL SZ 8 (GLOVE) ×2
GOWN STRL REUS W/ TWL LRG LVL3 (GOWN DISPOSABLE) ×2 IMPLANT
GOWN STRL REUS W/ TWL XL LVL3 (GOWN DISPOSABLE) ×3 IMPLANT
GOWN STRL REUS W/TWL LRG LVL3 (GOWN DISPOSABLE) ×4
GOWN STRL REUS W/TWL XL LVL3 (GOWN DISPOSABLE) ×6
K-WIRE SMTH SNGL TROCAR .028X4 (WIRE)
KIT BASIN OR (CUSTOM PROCEDURE TRAY) ×3 IMPLANT
KIT ROOM TURNOVER OR (KITS) ×3 IMPLANT
KWIRE SMTH SNGL TROCAR .028X4 (WIRE) IMPLANT
MANIFOLD NEPTUNE II (INSTRUMENTS) IMPLANT
NEEDLE HYPO 25GX1X1/2 BEV (NEEDLE) ×3 IMPLANT
NS IRRIG 1000ML POUR BTL (IV SOLUTION) ×3 IMPLANT
PACK ORTHO EXTREMITY (CUSTOM PROCEDURE TRAY) ×3 IMPLANT
PAD ARMBOARD 7.5X6 YLW CONV (MISCELLANEOUS) ×6 IMPLANT
PAD CAST 3X4 CTTN HI CHSV (CAST SUPPLIES) ×1 IMPLANT
PAD CAST 4YDX4 CTTN HI CHSV (CAST SUPPLIES) ×1 IMPLANT
PADDING CAST COTTON 3X4 STRL (CAST SUPPLIES) ×2
PADDING CAST COTTON 4X4 STRL (CAST SUPPLIES) ×2
SOLUTION BETADINE 4OZ (MISCELLANEOUS) ×3 IMPLANT
SPLINT FIBERGLASS 4X30 (CAST SUPPLIES) ×3 IMPLANT
SPONGE GAUZE 2X2 STER 10/PKG (GAUZE/BANDAGES/DRESSINGS)
SPONGE GAUZE 4X4 12PLY (GAUZE/BANDAGES/DRESSINGS) ×3 IMPLANT
SPONGE SCRUB IODOPHOR (GAUZE/BANDAGES/DRESSINGS) ×3 IMPLANT
SUCTION FRAZIER TIP 10 FR DISP (SUCTIONS) ×3 IMPLANT
SUT MERSILENE 4 0 P 3 (SUTURE) IMPLANT
SUT PROLENE 4 0 PS 2 18 (SUTURE) IMPLANT
SUT VIC AB 2-0 CT1 27 (SUTURE)
SUT VIC AB 2-0 CT1 TAPERPNT 27 (SUTURE) IMPLANT
SYR CONTROL 10ML LL (SYRINGE) ×3 IMPLANT
TOWEL OR 17X24 6PK STRL BLUE (TOWEL DISPOSABLE) ×3 IMPLANT
TOWEL OR 17X26 10 PK STRL BLUE (TOWEL DISPOSABLE) ×3 IMPLANT
TUBE CONNECTING 12'X1/4 (SUCTIONS) ×1
TUBE CONNECTING 12X1/4 (SUCTIONS) ×2 IMPLANT
UNDERPAD 30X30 INCONTINENT (UNDERPADS AND DIAPERS) ×3 IMPLANT
WATER STERILE IRR 1000ML POUR (IV SOLUTION) ×3 IMPLANT

## 2013-12-22 NOTE — H&P (Signed)
[name] is an 48 y.o. female.   Chief Complaint: History of fracture with ORIF and retained hardware with tendon adhesions will plan for removal of hardware today HPI: Patient presents for removal of hardware and repair reconstruction with removal of adhesions from the tendons of the hand. Patient presents for evaluation and treatment of the of their upper extremity predicament. The patient denies neck back chest or of abdominal pain. The patient notes that they have no lower extremity problems. The patient from primarily complains of the upper extremity pain noted. Past Medical History  Diagnosis Date  . Hepatitis B   . Sickle cell trait   . History of liver failure     r/t tylenol  . History of ESBL E. coli infection     Past Surgical History  Procedure Laterality Date  . Wisdom tooth extraction    . Incision and drainage intra oral abscess    . Open reduction internal fixation (orif) metacarpal Right 07/10/2013    Procedure: OPEN REDUCTION INTERNAL FIXATION (ORIF) METACARPAL RIGHT SMALL FINGER ;  Surgeon: Dominica SeverinWilliam Fortune Brannigan, MD;  Location: [facility] OR;  Service: Orthopedics;  Laterality: Right;  . Tubal ligation      Family History  Problem Relation Age of Onset  . Cancer Other   . Diabetes Other    Social History:  reports that she has been smoking.  She does not have any smokeless tobacco history on file. She reports that she drinks alcohol. She reports that she does not use illicit drugs.  Allergies:  Allergies  Allergen Reactions  . Acetaminophen Other (See Comments)    Reaction: liver failure  . Peroxide [Hydrogen Peroxide] Swelling    Medications Prior to Admission  Medication Sig Dispense Refill  . Biotin 1000 MCG tablet Take 1,000 mcg by mouth 3 (three) times daily.       . Cholecalciferol (VITAMIN D PO) Take 1 tablet by mouth 3 (three) times daily.      . fish oil-omega-3 fatty acids 1000 MG capsule Take 1 g by mouth daily.       Marland Kitchen. ibuprofen (ADVIL,MOTRIN) 600 MG  tablet Take 600 mg by mouth every 6 (six) hours as needed.      . Milk Thistle 1000 MG CAPS Take 2 capsules by mouth 3 (three) times daily with meals.      Marland Kitchen. oxyCODONE (OXY IR/ROXICODONE) 5 MG immediate release tablet Take 5 mg by mouth every 6 (six) hours as needed for severe pain.      . predniSONE (DELTASONE) 20 MG tablet Take 2 tablets (40 mg total) by mouth daily. Take 40 mg by mouth daily for 3 days, then 20mg  by mouth daily for 3 days, then 10mg  daily for 3 days  12 tablet  0  . vitamin B-12 (CYANOCOBALAMIN) 500 MCG tablet Take 500 mcg by mouth 3 (three) times daily.       . vitamin C (ASCORBIC ACID) 500 MG tablet Take 500 mg by mouth 3 (three) times daily.      . vitamin E 1000 UNIT capsule Take 1,000 Units by mouth 3 (three) times daily.        No results found for this or any previous visit (from the past 48 hour(s)). No results found.  Review of Systems  Respiratory: Negative.   Cardiovascular: Negative.   Gastrointestinal: Negative.   Genitourinary: Negative.   Neurological: Negative.   Endo/Heme/Allergies: Negative.     Blood pressure 127/85, pulse 69, temperature 98.4 F (36.9 C), temperature  source Oral, resp. rate 18, weight 91.173 kg (201 lb), last menstrual period 10/30/2013, SpO2 97.00%. Physical Exam  Retained hardware prominent about the hand with pain and loss of motion right  The patient is alert and oriented in no acute distress the patient complains of pain in the affected upper extremity.  The patient is noted to have a normal HEENT exam.  Lung fields show equal chest expansion and no shortness of breath  abdomen exam is nontender without distention.  Lower extremity examination does not show any fracture dislocation or blood clot symptoms.  Pelvis is stable neck and back are stable and nontender Assessment/Plan Plan for hardware removal 10 last tenosynovitis me in repair is necessary  We are planning surgery for your upper extremity. The risk and benefits  of surgery include risk of bleeding infection anesthesia damage to normal structures and failure of the surgery to accomplish its intended goals of relieving symptoms and restoring function with this in mind we'll going to proceed. I have specifically discussed with the patient the pre-and postoperative regime and the does and don'ts and risk and benefits in great detail. Risk and benefits of surgery also include risk of dystrophy chronic nerve pain failure of the healing process to go onto completion and other inherent risks of surgery The relavent the pathophysiology of the disease/injury process, as well as the alternatives for treatment and postoperative course of action has been discussed in great detail with the patient who desires to proceed.  We will do everything in our power to help you (the patient) restore function to the upper extremity. Is a pleasure to see this patient today.   Karen Chafe 12/22/2013, 4:05 PM

## 2013-12-22 NOTE — Op Note (Signed)
See Dictation # [id number] Amanda PeaGramig MD

## 2013-12-22 NOTE — Anesthesia Procedure Notes (Signed)
Procedure Name: LMA Insertion Date/Time: 12/22/2013 4:26 PM Performed by: Vita BarleyURNER, Christmas Faraci E Pre-anesthesia Checklist: Patient identified, Emergency Drugs available, Suction available and Patient being monitored Patient Re-evaluated:Patient Re-evaluated prior to inductionOxygen Delivery Method: Circle system utilized Preoxygenation: Pre-oxygenation with 100% oxygen Intubation Type: IV induction Ventilation: Mask ventilation without difficulty LMA: LMA inserted LMA Size: 4.0 Number of attempts: 1 Placement Confirmation: positive ETCO2 and breath sounds checked- equal and bilateral Tube secured with: Tape Dental Injury: Teeth and Oropharynx as per pre-operative assessment

## 2013-12-22 NOTE — Anesthesia Preprocedure Evaluation (Addendum)
Anesthesia Evaluation  Patient identified by MRN, date of birth, ID band Patient awake    Reviewed: Allergy & Precautions, H&P , NPO status , Patient's Chart, lab work & pertinent test results  Airway Mallampati: II TM Distance: >3 FB Neck ROM: Full    Dental  (+) Dental Advisory Given, Teeth Intact   Pulmonary Current Smoker,    Pulmonary exam normal       Cardiovascular     Neuro/Psych    GI/Hepatic (+) Hepatitis -, B  Endo/Other  Morbid obesity  Renal/GU negative Renal ROS     Musculoskeletal   Abdominal   Peds  Hematology  (+) Sickle cell trait ,   Anesthesia Other Findings   Reproductive/Obstetrics                          Anesthesia Physical Anesthesia Plan  ASA: II  Anesthesia Plan: General   Post-op Pain Management:    Induction: Intravenous  Airway Management Planned: LMA  Additional Equipment:   Intra-op Plan:   Post-operative Plan: Extubation in OR  Informed Consent: I have reviewed the patients History and Physical, chart, labs and discussed the procedure including the risks, benefits and alternatives for the proposed anesthesia with the patient or authorized representative who has indicated his/her understanding and acceptance.   Dental advisory given  Plan Discussed with: CRNA, Anesthesiologist and Surgeon  Anesthesia Plan Comments:         Anesthesia Quick Evaluation

## 2013-12-22 NOTE — Anesthesia Postprocedure Evaluation (Signed)
  Anesthesia Post-op Note  Patient: [name]  Procedure(s) Performed: Procedure(s): RIGHT HAND HARDWARE REMOVAL, TENOLYSIS/TENOSYNOVECTOMY (Right)  Patient Location: PACU  Anesthesia Type:General  Level of Consciousness: awake and alert   Airway and Oxygen Therapy: Patient Spontanous Breathing  Post-op Pain: mild  Post-op Assessment: Post-op Vital signs reviewed  Post-op Vital Signs: stable  Complications: No apparent anesthesia complications

## 2013-12-22 NOTE — Preoperative (Signed)
Beta Blockers   Reason not to administer Beta Blockers:Not Applicable 

## 2013-12-22 NOTE — Discharge Instructions (Signed)
Please keep your bandage clean and dry.  Please do not get your bandage wet.  Please call for any problems.  Please call the Keep bandage clean and dry.  Call for any problems.  No smoking.  Criteria for driving a car: you should be off your pain medicine for 7-8 hours, able to drive one handed(confident), thinking clearly and feeling able in your judgement to drive. Continue elevation as it will decrease swelling.  If instructed by MD move your fingers within the confines of the bandage/splint.  Use ice if instructed by your MD. Call immediately for any sudden loss of feeling in your hand/arm or change in functional abilities of the extremity.  We recommend that you to take vitamin C 1000 mg a day to promote healing we also recommend that if you require her pain medicine that he take a stool softener to prevent constipation as most pain medicines will have constipation side effects. We recommend either Peri-Colace or Senokot and recommend that you also consider adding MiraLAX to prevent the constipation affects from pain medicine if you are required to use them. These medicines are over the counter and maybe purchased at a local pharmacy.  What to eat:  For your first meals, you should eat lightly; only small meals initially.  If you do not have nausea, you may eat larger meals.  Avoid spicy, greasy and heavy food.    General Anesthesia, Adult, Care After  Refer to this sheet in the next few weeks. These instructions provide you with information on caring for yourself after your procedure. Your health care provider may also give you more specific instructions. Your treatment has been planned according to current medical practices, but problems sometimes occur. Call your health care provider if you have any problems or questions after your procedure.  WHAT TO EXPECT AFTER THE PROCEDURE  After the procedure, it is typical to experience:  Sleepiness.  Nausea and vomiting. HOME CARE INSTRUCTIONS    For the first 24 hours after general anesthesia:  Have a responsible person with you.  Do not drive a car. If you are alone, do not take public transportation.  Do not drink alcohol.  Do not take medicine that has not been prescribed by your health care provider.  Do not sign important papers or make important decisions.  You may resume a normal diet and activities as directed by your health care provider.  Change bandages (dressings) as directed.  If you have questions or problems that seem related to general anesthesia, call the hospital and ask for the anesthetist or anesthesiologist on call. SEEK MEDICAL CARE IF:  You have nausea and vomiting that continue the day after anesthesia.  You develop a rash. SEEK IMMEDIATE MEDICAL CARE IF:  You have difficulty breathing.  You have chest pain.  You have any allergic problems. Document Released: 12/22/2000 Document Revised: 05/18/2013 Document Reviewed: 03/31/2013  [facility] Patient Information 2014 [employer] [place].

## 2013-12-22 NOTE — Transfer of Care (Signed)
Immediate Anesthesia Transfer of Care Note  Patient: Adriana CourtsKimberly Cutrone  Procedure(s) Performed: Procedure(s): RIGHT HAND HARDWARE REMOVAL, TENOLYSIS/TENOSYNOVECTOMY (Right)  Patient Location: PACU  Anesthesia Type:General  Level of Consciousness: awake and alert   Airway & Oxygen Therapy: Patient Spontanous Breathing and Patient connected to nasal cannula oxygen  Post-op Assessment: Report given to PACU RN, Post -op Vital signs reviewed and stable and Patient moving all extremities X 4  Post vital signs: Reviewed and stable  Complications: No apparent anesthesia complications

## 2013-12-23 ENCOUNTER — Encounter (HOSPITAL_COMMUNITY): Payer: Self-pay | Admitting: Orthopedic Surgery

## 2013-12-23 NOTE — Op Note (Signed)
NAMWylene Men:  Radi, Bralyn             ACCOUNT NO.:  [id number]  MEDICAL RECORD NO.:  [id number]  LOCATION:  [facility]                         FACILITY:  [facility]  PHYSICIAN:  Dionne AnoWilliam M. Adelisa Satterwhite, M.D.DATE OF BIRTH:  1966-09-03  DATE OF PROCEDURE: DATE OF DISCHARGE:                              OPERATIVE REPORT   PREOPERATIVE DIAGNOSIS:  Retained hardware status post major reconstruction, right hand fifth metacarpal with tendon adhesions, retained hardware and pain.  POSTOPERATIVE DIAGNOSIS:  Retained hardware status post major reconstruction, right hand fifth metacarpal with tendon adhesions, retained hardware and pain.  PROCEDURE: 1. Tenolysis, tenosynovectomy extensor digiti minimi, right hand. 2. Superficial dorsal sensory ulnar nerve neurolysis extensive in     nature, right hand. 3. Deep hardware removal, right hand. 4. Stress radiography.  SURGEON:  Dionne AnoWilliam M. Amanda PeaGramig, M.D.  ASSISTANT:  None.  COMPLICATIONS:  None.  ANESTHESIA:  General.  TOURNIQUET TIME:  Less than an hour.  INDICATIONS:  A pleasant female with a history of fracture process and reconstruction.  She presents with pain and limited motion secondary tendon adhesions.  I have discussed with her the risks, benefits, options, and plans, she desires to proceed with the above-mentioned treatment algorithm.  OPERATIVE PROCEDURE:  The patient was seen by myself and Anesthesia, taken to the operative suite, underwent smooth induction of general anesthesia.  Prepped and draped in usual sterile fashion with Betadine scrub and paint, and following this underwent elevation of the arm and tourniquet insufflation.  Final time-out was called.  Preoperative antibiotics were given in the form of Ancef.  The patient then underwent a curvilinear incision.  I performed scar removal about the keloid region followed by removal of the skin including the dermis in this region.  I then carefully dissected down and performed a dorsal  sensory ulnar nerve neurolysis as it was imbedded in scar tissue and swept this out of harm's way and into the fatty tissue.  Following this, we very carefully and cautiously I performed a tenolysis of the extensor digitorum minimi.  With orthopedic instrument facial nerve dissector and scissors, I performed a very careful and cautious, removal of the adhesions and tenosynovectomy.  This was done without difficulty and I was able to re-establish intact excursion parameters.  Following this, I then performed very careful and cautious evaluation of the plate and removed the plate and screw construct without difficulty. I kept the plate for her to be processed and given to the patient as she requested.  Following this, stress radiography revealed excellent position and I was pleased with the position of her metacarpal and the alignment and in fact there were no complicating features.  Her joint integrity looked quite well.  Following this, we irrigated, deflated the tourniquet, closed the wound with Prolene, placed 10 mL of Sensorcaine with epi in the wound and placed her in a splint.  She will return to see us in 14 days, knows to call us should any problems occur.  These notes have been discussed and all questions have been encouraged.  It was a pleasure seeing her and treat her today.  We look forward to participate in her postop care.     Dionne AnoWilliam M. Amanda PeaGramig, M.D.  WMG/MEDQ  D:  12/22/2013  T:  12/23/2013  Job:  [id number]

## 2015-06-15 ENCOUNTER — Encounter (HOSPITAL_COMMUNITY): Payer: Self-pay | Admitting: *Deleted

## 2015-06-15 ENCOUNTER — Emergency Department (HOSPITAL_COMMUNITY)
Admission: EM | Admit: 2015-06-15 | Discharge: 2015-06-15 | Disposition: A | Discharge status: Home / Self Care | Payer: No Typology Code available for payment source | Attending: Emergency Medicine | Admitting: Emergency Medicine

## 2015-06-15 DIAGNOSIS — Z8619 Personal history of other infectious and parasitic diseases: Secondary | ICD-10-CM | POA: Insufficient documentation

## 2015-06-15 DIAGNOSIS — Z720 Tobacco use: Secondary | ICD-10-CM | POA: Insufficient documentation

## 2015-06-15 DIAGNOSIS — L03115 Cellulitis of right lower limb: Secondary | ICD-10-CM | POA: Insufficient documentation

## 2015-06-15 DIAGNOSIS — Z8719 Personal history of other diseases of the digestive system: Secondary | ICD-10-CM | POA: Insufficient documentation

## 2015-06-15 DIAGNOSIS — Z79899 Other long term (current) drug therapy: Secondary | ICD-10-CM | POA: Insufficient documentation

## 2015-06-15 DIAGNOSIS — Z862 Personal history of diseases of the blood and blood-forming organs and certain disorders involving the immune mechanism: Secondary | ICD-10-CM | POA: Insufficient documentation

## 2015-06-15 MED ORDER — OXYCODONE HCL 5 MG PO TABS
5.0000 mg | ORAL_TABLET | Freq: Once | ORAL | Status: AC
  Administered 2015-06-15: 5 mg via ORAL
  Filled 2015-06-15: qty 1

## 2015-06-15 MED ORDER — HYDROCODONE-IBUPROFEN 7.5-200 MG PO TABS: 1.0000 | ORAL_TABLET | Freq: Four times a day (QID) | ORAL | Status: DC | PRN

## 2015-06-15 MED ORDER — CEPHALEXIN 500 MG PO CAPS: 500.0000 mg | ORAL_CAPSULE | Freq: Four times a day (QID) | ORAL | Status: DC

## 2015-06-15 MED ORDER — CEPHALEXIN 250 MG PO CAPS
250.0000 mg | ORAL_CAPSULE | Freq: Once | ORAL | Status: AC
  Administered 2015-06-15: 250 mg via ORAL
  Filled 2015-06-15: qty 1

## 2015-06-15 NOTE — ED Provider Notes (Signed)
CSN: [id number]     Arrival date & time 06/15/15  2214 History  This chart was scribed for non-physician practitioner, Emilia Beck, PA-C working with Eber Hong, MD by Doreatha Martin, ED scribe. This patient was seen in room WTR8/WTR8 and the patient's care was started at 11:10 PM    Chief Complaint  Patient presents with  . Abscess   The history is provided by the patient. No language interpreter was used.    HPI Comments: [name] is a 49 y.o. female who presents to the Emergency Department complaining of a moderate, gradually worsening area of throbbing, burning pain and swelling to the medial aspect of the back of her right thigh onset a month ago and worsened 2 days ago.  She states associated serosanguinous drainage, sleep disturbance secondary to pain, fever (Tmax 99). She notes that pain is worsened with palpation. She states no relief with Ibuprofen. She has also tried neosporin and witch hazel with no relief. Pt is allergic to acetaminophen. She denies any other symptoms.    Past Medical History  Diagnosis Date  . Hepatitis B   . Sickle cell trait   . History of liver failure     r/t tylenol  . History of ESBL E. coli infection    Past Surgical History  Procedure Laterality Date  . Wisdom tooth extraction    . Incision and drainage intra oral abscess    . Open reduction internal fixation (orif) metacarpal Right 07/10/2013    Procedure: OPEN REDUCTION INTERNAL FIXATION (ORIF) METACARPAL RIGHT SMALL FINGER ;  Surgeon: Dominica Severin, MD;  Location: [facility] OR;  Service: Orthopedics;  Laterality: Right;  . Tubal ligation    . Hardware removal Right 12/22/2013    Procedure: RIGHT HAND HARDWARE REMOVAL, TENOLYSIS/TENOSYNOVECTOMY;  Surgeon: Dominica Severin, MD;  Location: [facility] OR;  Service: Orthopedics;  Laterality: Right;   Family History  Problem Relation Age of Onset  . Cancer Other   . Diabetes Other    Social History  Substance Use Topics  . Smoking status: Current  Some Day Smoker -- 0.15 packs/day for 30 years  . Smokeless tobacco: None     Comment: Still intermittently smoking, was up to half pack a day  . Alcohol Use: Yes     Comment: on holiday's   OB History    No data available     Review of Systems  Constitutional: Positive for fever.  Musculoskeletal: Positive for joint swelling and arthralgias.  Skin: Positive for color change and wound ( serosanguinous drainage).  Psychiatric/Behavioral: Positive for sleep disturbance.  All other systems reviewed and are negative.  Allergies  Acetaminophen and Peroxide  Home Medications   Prior to Admission medications   Medication Sig Start Date End Date Taking? Authorizing Provider  Biotin 1000 MCG tablet Take 1,000 mcg by mouth 3 (three) times daily.     Historical Provider, MD  Cholecalciferol (VITAMIN D PO) Take 1 tablet by mouth 3 (three) times daily.    Historical Provider, MD  fish oil-omega-3 fatty acids 1000 MG capsule Take 1 g by mouth daily.     Historical Provider, MD  ibuprofen (ADVIL,MOTRIN) 600 MG tablet Take 600 mg by mouth every 6 (six) hours as needed.    Historical Provider, MD  Milk Thistle 1000 MG CAPS Take 2 capsules by mouth 3 (three) times daily with meals.    Historical Provider, MD  oxyCODONE (OXY IR/ROXICODONE) 5 MG immediate release tablet Take 5 mg by mouth every 6 (six)  hours as needed for severe pain.    Historical Provider, MD  oxycodone (OXY-IR) 5 MG capsule Take 1 capsule (5 mg total) by mouth every 4 (four) hours as needed. 12/22/13   Dominica Severin, MD  predniSONE (DELTASONE) 20 MG tablet Take 2 tablets (40 mg total) by mouth daily. Take 40 mg by mouth daily for 3 days, then  by mouth daily for 3 days, then  daily for 3 days 11/24/13   Garlon Hatchet, PA-C  vitamin B-12 (CYANOCOBALAMIN) 500 MCG tablet Take 500 mcg by mouth 3 (three) times daily.     Historical Provider, MD  vitamin C (ASCORBIC ACID) 500 MG tablet Take 500 mg by mouth 3 (three) times daily.     Historical Provider, MD  vitamin E 1000 UNIT capsule Take 1,000 Units by mouth 3 (three) times daily.    Historical Provider, MD   BP 122/85 mmHg  Pulse 102  Temp(Src) 98.2 F (36.8 C) (Oral)  Resp 18  Ht  (1.702 m)  Wt 197 lb 12.8 oz (89.721 kg)  BMI 30.97 kg/m2  SpO2 94%  LMP 05/27/2015 Physical Exam  Constitutional: She is oriented to person, place, and time. She appears well-developed and well-nourished.  HENT:  Head: Normocephalic and atraumatic.  Eyes: Conjunctivae and EOM are normal. Pupils are equal, round, and reactive to light.  Neck: Normal range of motion. Neck supple.  Cardiovascular: Normal rate.   Pulmonary/Chest: Effort normal. No respiratory distress.  Abdominal: She exhibits no distension.  Musculoskeletal: Normal range of motion.  Neurological: She is alert and oriented to person, place, and time. Coordination normal.  Skin: Skin is warm and dry.  3x3cm area of edema and TTP. No drainage or open wound noted. No red streaking.   Psychiatric: She has a normal mood and affect. Her behavior is normal.  Nursing note and vitals reviewed.   ED Course  Procedures (including critical care time) DIAGNOSTIC STUDIES: Oxygen Saturation is 94% on RA, adequate by my interpretation.    COORDINATION OF CARE: 11:15 PM Discussed treatment plan with pt at bedside and pt agreed to plan.   Labs Review Labs Reviewed - No data to display  Imaging Review No results found. I have personally reviewed and evaluated these images and lab results as part of my medical decision-making.   EKG Interpretation None      MDM   Final diagnoses:  Cellulitis of right lower extremity   Patient treated with keflex and oxycodone. No red streaking or signs of spreading infection. Vitals stable and patient afebrile.   I personally performed the services described in this documentation, which was scribed in my presence. The recorded information has been reviewed and is accurate.     Emilia Beck, PA-C 06/16/15 0100  Eber Hong, MD 06/16/15 [phone]

## 2015-06-15 NOTE — ED Notes (Signed)
Pt states that she has had a "bump" to the back of her rt leg x 3 weeks; pt states that it was bleeding and draining earlier; pt states that it is painful

## 2015-06-15 NOTE — ED Notes (Signed)
AVS explained in detail. Knows to keep wound clean and dry and to hold warm compresses. Knows to take entire abx regimen. No other c/c.

## 2015-06-15 NOTE — Discharge Instructions (Signed)
Take keflex as directed until gone. Take vicoprofen as needed for pain. Refer to attached documents for more information.

## 2015-10-21 ENCOUNTER — Encounter (HOSPITAL_COMMUNITY): Payer: Self-pay | Admitting: Emergency Medicine

## 2015-10-21 ENCOUNTER — Emergency Department (HOSPITAL_COMMUNITY)
Admission: EM | Admit: 2015-10-21 | Discharge: 2015-10-21 | Disposition: A | Discharge status: Home / Self Care | Payer: No Typology Code available for payment source | Attending: Emergency Medicine | Admitting: Emergency Medicine

## 2015-10-21 DIAGNOSIS — Z862 Personal history of diseases of the blood and blood-forming organs and certain disorders involving the immune mechanism: Secondary | ICD-10-CM | POA: Insufficient documentation

## 2015-10-21 DIAGNOSIS — F17200 Nicotine dependence, unspecified, uncomplicated: Secondary | ICD-10-CM | POA: Insufficient documentation

## 2015-10-21 DIAGNOSIS — Z8619 Personal history of other infectious and parasitic diseases: Secondary | ICD-10-CM | POA: Insufficient documentation

## 2015-10-21 DIAGNOSIS — J029 Acute pharyngitis, unspecified: Secondary | ICD-10-CM | POA: Insufficient documentation

## 2015-10-21 DIAGNOSIS — Z8719 Personal history of other diseases of the digestive system: Secondary | ICD-10-CM | POA: Insufficient documentation

## 2015-10-21 DIAGNOSIS — H9202 Otalgia, left ear: Secondary | ICD-10-CM | POA: Insufficient documentation

## 2015-10-21 DIAGNOSIS — Z792 Long term (current) use of antibiotics: Secondary | ICD-10-CM | POA: Insufficient documentation

## 2015-10-21 DIAGNOSIS — Z79899 Other long term (current) drug therapy: Secondary | ICD-10-CM | POA: Insufficient documentation

## 2015-10-21 LAB — RAPID STREP SCREEN (MED CTR MEBANE ONLY): Streptococcus, Group A Screen (Direct): NEGATIVE

## 2015-10-21 MED ORDER — KETOROLAC TROMETHAMINE 30 MG/ML IJ SOLN
30.0000 mg | Freq: Once | INTRAMUSCULAR | Status: AC
  Administered 2015-10-21: 30 mg via INTRAMUSCULAR
  Filled 2015-10-21: qty 1

## 2015-10-21 MED ORDER — NAPROXEN 500 MG PO TABS: 500.0000 mg | ORAL_TABLET | Freq: Two times a day (BID) | ORAL | Status: DC

## 2015-10-21 MED ORDER — PHENOL 1.4 % MT LIQD: 2.0000 | OROMUCOSAL | Status: DC | PRN

## 2015-10-21 NOTE — ED Notes (Signed)
Throat pain, nasal drainage, lt ear pain for the past 6 days took motrin with minimal relief, temp 98.2, just not getting any better. No one else at home is sick.

## 2015-10-21 NOTE — Discharge Instructions (Signed)
Your rapid strep test was negative. Your symptoms are most likely due to a virus. Due to your intolerance to tylenol you may continue taking ibuprofen or naproxen for pain and aches. I will also give you a prescription for a throat spray to use as need for sore throat.   Take medications as prescribed. Return to the emergency room for worsening condition or new concerning symptoms. Follow up with your regular doctor. If you don't have a regular doctor use one of the numbers below to establish a primary care doctor.   Emergency Department Resource Guide 1) Find a Doctor and Pay Out of Pocket Although you won't have to find out who is covered by your insurance plan, it is a good idea to ask around and get recommendations. You will then need to call the office and see if the doctor you have chosen will accept you as a new patient and what types of options they offer for patients who are self-pay. Some doctors offer discounts or will set up payment plans for their patients who do not have insurance, but you will need to ask so you aren't surprised when you get to your appointment.  2) Contact Your Local Health Department Not all health departments have doctors that can see patients for sick visits, but many do, so it is worth a call to see if yours does. If you don't know where your local health department is, you can check in your phone book. The CDC also has a tool to help you locate your state's health department, and many state websites also have listings of all of their local health departments.  3) Find a Walk-in Clinic If your illness is not likely to be very severe or complicated, you may want to try a walk in clinic. These are popping up all over the country in pharmacies, drugstores, and shopping centers. They're usually staffed by nurse practitioners or physician assistants that have been trained to treat common illnesses and complaints. They're usually fairly quick and inexpensive. However, if  you have serious medical issues or chronic medical problems, these are probably not your best option.  No Primary Care Doctor: - Call Health Connect at  [phone] - they can help you locate a primary care doctor that  accepts your insurance, provides certain services, etc. - Physician Referral [phone]  Emergency Department Resource Guide 1) Find a Doctor and Pay Out of Pocket Although you won't have to find out who is covered by your insurance plan, it is a good idea to ask around and get recommendations. You will then need to call the office and see if the doctor you have chosen will accept you as a new patient and what types of options they offer for patients who are self-pay. Some doctors offer discounts or will set up payment plans for their patients who do not have insurance, but you will need to ask so you aren't surprised when you get to your appointment.  2) Contact Your Local Health Department Not all health departments have doctors that can see patients for sick visits, but many do, so it is worth a call to see if yours does. If you don't know where your local health department is, you can check in your phone book. The CDC also has a tool to help you locate your state's health department, and many state websites also have listings of all of their local health departments.  3) Find a Walk-in Clinic If your illness is not  likely to be very severe or complicated, you may want to try a walk in clinic. These are popping up all over the country in pharmacies, drugstores, and shopping centers. They're usually staffed by nurse practitioners or physician assistants that have been trained to treat common illnesses and complaints. They're usually fairly quick and inexpensive. However, if you have serious medical issues or chronic medical problems, these are probably not your best option.  No Primary Care Doctor: - Call Health Connect at  [phone] - they can help you locate a primary  care doctor that  accepts your insurance, provides certain services, etc. - Physician Referral Service- [phone]  Chronic Pain Problems: Organization         Address  Phone   Notes  [facility]  [phone] Patients need to be referred by their primary care doctor.   Medication Assistance: Organization         Address  Phone   Notes  [facility] [address] [place] [address] [place], [place] [address] [phone] --Must be a resident of [facility] -- Must have NO insurance coverage whatsoever (no Medicaid/ Medicare, etc.) -- The pt. MUST have a primary care doctor that directs their care regularly and follows them in the community   [facility]  [phone]   [employer]  [phone]    Agencies that provide inexpensive medical care: Organization         Address  Phone   Notes  [facility]  [phone]   [facility]    [phone]   [facility] [address] [place], [place] [address] [phone]   [facility] [address] [place] [place] [phone]   [facility]    [phone]   [facility]    [phone]   [facility]  [address], [place] Phone:  [phone], Fax:  [phone] Hours of Operation:  9 am - 6 pm, M-F.  Also accepts Medicaid/Medicare and self-pay.  [facility]  [address], [place] Phone: [phone], Fax: [phone]. Hours of Operation:  8:30 am - 5:30 pm, M-F.  Also accepts Medicaid and self-pay.  [facility] [place] [address], [place] Phone: [phone]   [facility] [address] Natasha Bence [place], [place] [phone], Ext. [phone] Mondays & Thursdays: 7-9 AM.  First 15 patients are seen on a first come, first serve basis.    Medicaid-accepting [facility]  Providers:  Organization         Address  Phone   Notes  [facility] [address],  [phone] Also accepts self-pay patients.  [facility] [address] Laurell Josephs [place], [place]  [phone]   [facility] [address], [place] [phone]   [facility] [address], [place] [phone]   Renaye Rakers [address], [place]   [phone] Only accepts [place] patients after they have their name applied to their card.   Self-Pay (no insurance) in [facility]:  Organization         Address  Phone   Notes  Sickle Cell Patients, [facility] [address] [place], [place] [phone]   [facility]  [facility] [address] [place], [place] [phone]   [facility]  [address], [place] [phone]   [facility]/Dr. Osei-Bonsu  [address] [place] or [address], [place] [phone] Phone number for both [place] and [place] locations is the same.  [facility] [address], [place] [phone]   [facility] [address], [place] or [address] [phone]   [facility] [address], [place] [phone], phone; [phone], fax Sees patients 1st and 3rd Saturday of every month.  Must not qualify for public or private insurance (i.e. Medicaid, Medicare, [place] Health Choice, Veterans' Benefits)  Household income should be no more than 200% of the poverty level The clinic cannot treat you if you are pregnant or think you are pregnant  Sexually transmitted diseases are not treated at the clinic.     Pharyngitis Pharyngitis is redness, pain, and swelling (inflammation) of your pharynx.  CAUSES  Pharyngitis is  usually caused by infection. Most of the time, these infections are from viruses (viral) and are part of a cold. However, sometimes pharyngitis is caused by bacteria (bacterial). Pharyngitis can also be caused by allergies. Viral pharyngitis may be spread from person to person by coughing, sneezing, and personal items or utensils (cups, forks, spoons, toothbrushes). Bacterial pharyngitis may be spread from person to person by more intimate contact, such as kissing.  SIGNS AND SYMPTOMS  Symptoms of pharyngitis include:   Sore throat.   Tiredness (fatigue).   Low-grade fever.   Headache.  Joint pain and muscle aches.  Skin rashes.  Swollen lymph nodes.  Plaque-like film on throat or tonsils (often seen with bacterial pharyngitis). DIAGNOSIS  Your health care provider will ask you questions about your illness and your symptoms. Your medical history, along with a physical exam, is often all that is needed to diagnose pharyngitis. Sometimes, a rapid strep test is done. Other lab tests may also be done, depending on the suspected cause.  TREATMENT  Viral pharyngitis will usually get better in 3-4 days without the use of medicine. Bacterial pharyngitis is treated with medicines that kill germs (antibiotics).  HOME CARE INSTRUCTIONS   Drink enough water and fluids to keep your urine clear or pale yellow.   Only take over-the-counter or prescription medicines as directed by your health care provider:   If you are prescribed antibiotics, make sure you finish them even if you start to feel better.   Do not take aspirin.   Get lots of rest.   Gargle with 8 oz of salt water ( tsp of salt per 1 qt of water) as often as every 1-2 hours to soothe your throat.   Throat lozenges (if you are not at risk for choking) or sprays may be used to soothe your throat. SEEK MEDICAL CARE IF:   You have large, tender lumps in your neck.  You have a rash.  You cough up green, yellow-brown, or  bloody spit. SEEK IMMEDIATE MEDICAL CARE IF:   Your neck becomes stiff.  You drool or are unable to swallow liquids.  You vomit or are unable to keep medicines or liquids down.  You have severe pain that does not go away with the use of recommended medicines.  You have trouble breathing (not caused by a stuffy nose). MAKE SURE YOU:  Understand these instructions.  Will watch your condition.  Will get help right away if you are not doing well or get worse.   This information is not intended to replace advice given to you by your health care provider. Make sure you discuss any questions you have with your health care provider.   Document Released: 09/15/2005 Document Revised: 07/06/2013 Document Reviewed: 05/23/2013 Elsevier Interactive Patient Education [employer]

## 2015-10-21 NOTE — ED Provider Notes (Signed)
CSN: [id number]     Arrival date & time 10/21/15  1530 History  By signing my name below, I, Doreatha Martin, attest that this documentation has been prepared under the direction and in the presence of Channah Godeaux Y Miguelina Fore, [place]. Electronically Signed: Doreatha Martin, ED Scribe. 10/21/2015. 5:53 PM.    Chief Complaint  Patient presents with  . Sore Throat  . Ear Pain   The history is provided by the patient. No language interpreter was used.    HPI Comments: [name] is a 50 y.o. female who presents to the Emergency Department complaining of moderate sore throat for 6 days with associated bilateral otalgia (worse on the left), fever (Tmax 99 yesterday). Pt states her pain is worsened with swallowing, head turning and speaking. Pt states she has taken ibuprofen with no relief. She also notes that she has been gargling salt water with no relief. She denies coughing, difficulty swallowing, difficulty tolerating secretions, choking, chills, nausea, emesis.    Past Medical History  Diagnosis Date  . Hepatitis B   . Sickle cell trait (HCC)   . History of liver failure     r/t tylenol  . History of ESBL E. coli infection    Past Surgical History  Procedure Laterality Date  . Wisdom tooth extraction    . Incision and drainage intra oral abscess    . Open reduction internal fixation (orif) metacarpal Right 07/10/2013    Procedure: OPEN REDUCTION INTERNAL FIXATION (ORIF) METACARPAL RIGHT SMALL FINGER ;  Surgeon: Dominica Severin, MD;  Location: [facility] OR;  Service: Orthopedics;  Laterality: Right;  . Tubal ligation    . Hardware removal Right 12/22/2013    Procedure: RIGHT HAND HARDWARE REMOVAL, TENOLYSIS/TENOSYNOVECTOMY;  Surgeon: Dominica Severin, MD;  Location: [facility] OR;  Service: Orthopedics;  Laterality: Right;   Family History  Problem Relation Age of Onset  . Cancer Other   . Diabetes Other    Social History  Substance Use Topics  . Smoking status: Current Some Day Smoker -- 0.15 packs/day for 30  years  . Smokeless tobacco: None     Comment: Still intermittently smoking, was up to half pack a day  . Alcohol Use: Yes     Comment: on holiday's   OB History    No data available     Review of Systems  Constitutional: Positive for fever. Negative for chills.  HENT: Positive for ear pain and sore throat. Negative for trouble swallowing.   Respiratory: Negative for cough.   Gastrointestinal: Negative for nausea and vomiting.  All other systems reviewed and are negative.  Allergies  Acetaminophen and Peroxide  Home Medications   Prior to Admission medications   Medication Sig Start Date End Date Taking? Authorizing Provider  Biotin 1000 MCG tablet Take 1,000 mcg by mouth 3 (three) times daily.     Historical Provider, MD  cephALEXin (KEFLEX) 500 MG capsule Take 1 capsule (500 mg total) by mouth 4 (four) times daily. 06/15/15   Emilia Beck, PA-C  Cholecalciferol (VITAMIN D PO) Take 1 tablet by mouth 3 (three) times daily.    Historical Provider, MD  fish oil-omega-3 fatty acids 1000 MG capsule Take 1 g by mouth daily.     Historical Provider, MD  HYDROcodone-ibuprofen (VICOPROFEN) 7.5-200 MG per tablet Take 1 tablet by mouth every 6 (six) hours as needed for moderate pain. 06/15/15   Kaitlyn Szekalski, PA-C  ibuprofen (ADVIL,MOTRIN) 600 MG tablet Take 600 mg by mouth every 6 (six) hours as needed.  Historical Provider, MD  Milk Thistle 1000 MG CAPS Take 2 capsules by mouth 3 (three) times daily with meals.    Historical Provider, MD  oxyCODONE (OXY IR/ROXICODONE) 5 MG immediate release tablet Take 5 mg by mouth every 6 (six) hours as needed for severe pain.    Historical Provider, MD  oxycodone (OXY-IR) 5 MG capsule Take 1 capsule (5 mg total) by mouth every 4 (four) hours as needed. 12/22/13   Dominica Severin, MD  predniSONE (DELTASONE) 20 MG tablet Take 2 tablets (40 mg total) by mouth daily. Take 40 mg by mouth daily for 3 days, then  by mouth daily for 3 days, then   daily for 3 days 11/24/13   Garlon Hatchet, PA-C  vitamin B-12 (CYANOCOBALAMIN) 500 MCG tablet Take 500 mcg by mouth 3 (three) times daily.     Historical Provider, MD  vitamin C (ASCORBIC ACID) 500 MG tablet Take 500 mg by mouth 3 (three) times daily.    Historical Provider, MD  vitamin E 1000 UNIT capsule Take 1,000 Units by mouth 3 (three) times daily.    Historical Provider, MD   BP 133/82 mmHg  Pulse 90  Temp(Src) 98.2 F (36.8 C)  Resp 20  SpO2 99% Physical Exam  Constitutional: She is oriented to person, place, and time. She appears well-developed and well-nourished.  HENT:  Head: Normocephalic and atraumatic.  Right Ear: Tympanic membrane normal. Tympanic membrane is not erythematous and not bulging.  Left Ear: Tympanic membrane normal. Tympanic membrane is not erythematous and not bulging.  Mouth/Throat: Posterior oropharyngeal erythema present. No oropharyngeal exudate.  Posterior oropharynx mildly erythematous. No tonsillar enlargement or exudate. Bilateral TMs clear. No erythema or bulging.   Eyes: Conjunctivae and EOM are normal. Pupils are equal, round, and reactive to light.  Neck: Normal range of motion. Neck supple.  Left anterior and cervical lymphadenopathy.   Cardiovascular: Normal rate, regular rhythm and normal heart sounds.  Exam reveals no gallop and no friction rub.   No murmur heard. Pulmonary/Chest: Effort normal and breath sounds normal. No respiratory distress. She has no wheezes. She has no rales.  Lungs CTA bilaterally.   Abdominal: She exhibits no distension.  Musculoskeletal: Normal range of motion.  Lymphadenopathy:    She has cervical adenopathy.  Neurological: She is alert and oriented to person, place, and time.  Skin: Skin is warm and dry.  Psychiatric: She has a normal mood and affect. Her behavior is normal.  Nursing note and vitals reviewed.   ED Course  Procedures (including critical care time) DIAGNOSTIC STUDIES: Oxygen Saturation is  99% on RA, normal by my interpretation.    COORDINATION OF CARE: 5:51 PM Discussed treatment plan with pt at bedside which includes rapid strep and pt agreed to plan.   Labs Review Labs Reviewed  RAPID STREP SCREEN (NOT AT [facility])  CULTURE, GROUP A STREP [facility])   I have personally reviewed and evaluated these lab results as part of my medical decision-making.   MDM   Final diagnoses:  Pharyngitis  Otalgia, left    RApid strep negative. Likely viral pharyngitis/URI. Rx given for chloraseptic spray given pt's complaint of extreme sore throat, though she was able to yell at me from inside the room while I was outside. Encouraged NSAIDs as needed for pain. Pt cannot tolerate acetaminophen due to history of liver failure. Resource guide given for pcp follow up. ER return precautions given.  I personally performed the services described in this documentation, which was  scribed in my presence. The recorded information has been reviewed and is accurate.   Carlene Coria, PA-C 10/21/15 2334  Eber Hong, MD 10/22/15 [phone]

## 2015-10-24 LAB — CULTURE, GROUP A STREP (THRC)

## 2016-01-26 ENCOUNTER — Encounter (HOSPITAL_COMMUNITY): Payer: Self-pay

## 2016-01-26 ENCOUNTER — Emergency Department (HOSPITAL_COMMUNITY)
Admission: EM | Admit: 2016-01-26 | Discharge: 2016-01-26 | Disposition: A | Discharge status: Home / Self Care | Payer: No Typology Code available for payment source | Attending: Emergency Medicine | Admitting: Emergency Medicine

## 2016-01-26 DIAGNOSIS — Z79899 Other long term (current) drug therapy: Secondary | ICD-10-CM | POA: Insufficient documentation

## 2016-01-26 DIAGNOSIS — Z8719 Personal history of other diseases of the digestive system: Secondary | ICD-10-CM | POA: Insufficient documentation

## 2016-01-26 DIAGNOSIS — Z8619 Personal history of other infectious and parasitic diseases: Secondary | ICD-10-CM | POA: Insufficient documentation

## 2016-01-26 DIAGNOSIS — Z862 Personal history of diseases of the blood and blood-forming organs and certain disorders involving the immune mechanism: Secondary | ICD-10-CM | POA: Insufficient documentation

## 2016-01-26 DIAGNOSIS — Z792 Long term (current) use of antibiotics: Secondary | ICD-10-CM | POA: Insufficient documentation

## 2016-01-26 DIAGNOSIS — L509 Urticaria, unspecified: Secondary | ICD-10-CM | POA: Insufficient documentation

## 2016-01-26 DIAGNOSIS — Z791 Long term (current) use of non-steroidal anti-inflammatories (NSAID): Secondary | ICD-10-CM | POA: Insufficient documentation

## 2016-01-26 DIAGNOSIS — F17200 Nicotine dependence, unspecified, uncomplicated: Secondary | ICD-10-CM | POA: Insufficient documentation

## 2016-01-26 MED ORDER — PREDNISONE 50 MG PO TABS: ORAL_TABLET | ORAL | Status: DC

## 2016-01-26 NOTE — Discharge Instructions (Signed)
1 to 2 tablets of 25 mg Benadryl pills every 4-6 hours as needed to a maximum of 300 mg per day. In addition, you may apply a topical hydrocortisone ointment to all affected areas except for the face.   Do not hesitate to call 911 or return to the emergency room if you develop any shortness of breath, wheezing, tongue or lip swelling.  Please follow with your primary care doctor in the next 2 days for a check-up. They must obtain records for further management.   Do not hesitate to return to the Emergency Department for any new, worsening or concerning symptoms.    Hives Hives are itchy, red, swollen areas of the skin. They can vary in size and location on your body. Hives can come and go for hours or several days (acute hives) or for several weeks (chronic hives). Hives do not spread from person to person (noncontagious). They may get worse with scratching, exercise, and emotional stress. CAUSES   Allergic reaction to food, additives, or drugs.  Infections, including the common cold.  Illness, such as vasculitis, lupus, or thyroid disease.  Exposure to sunlight, heat, or cold.  Exercise.  Stress.  Contact with chemicals. SYMPTOMS   Red or white swollen patches on the skin. The patches may change size, shape, and location quickly and repeatedly.  Itching.  Swelling of the hands, feet, and face. This may occur if hives develop deeper in the skin. DIAGNOSIS  Your caregiver can usually tell what is wrong by performing a physical exam. Skin or blood tests may also be done to determine the cause of your hives. In some cases, the cause cannot be determined. TREATMENT  Mild cases usually get better with medicines such as antihistamines. Severe cases may require an emergency epinephrine injection. If the cause of your hives is known, treatment includes avoiding that trigger.  HOME CARE INSTRUCTIONS   Avoid causes that trigger your hives.  Take antihistamines as directed by your  caregiver to reduce the severity of your hives. Non-sedating or low-sedating antihistamines are usually recommended. Do not drive while taking an antihistamine.  Take any other medicines prescribed for itching as directed by your caregiver.  Wear loose-fitting clothing.  Keep all follow-up appointments as directed by your caregiver. SEEK MEDICAL CARE IF:   You have persistent or severe itching that is not relieved with medicine.  You have painful or swollen joints. SEEK IMMEDIATE MEDICAL CARE IF:   You have a fever.  Your tongue or lips are swollen.  You have trouble breathing or swallowing.  You feel tightness in the throat or chest.  You have abdominal pain. These problems may be the first sign of a life-threatening allergic reaction. Call your local emergency services (911 in [place] MAKE SURE YOU:   Understand these instructions.  Will watch your condition.  Will get help right away if you are not doing well or get worse.   This information is not intended to replace advice given to you by your health care provider. Make sure you discuss any questions you have with your health care provider.   Document Released: 09/15/2005 Document Revised: 09/20/2013 Document Reviewed: 12/09/2011 Elsevier Interactive Patient Education [employer]2016 [employer]vier Inc.

## 2016-01-26 NOTE — ED Notes (Signed)
She c/o pruritic rash x 2 weeks.  She states raised, red areas will form, then resolve; then others will form other places.  She is in no distress and is otherwise healthy.

## 2016-01-26 NOTE — ED Notes (Signed)
No answer x1

## 2016-01-26 NOTE — ED Provider Notes (Signed)
CSN: [id number]     Arrival date & time 01/26/16  1402 History  By signing my name below, I, Linna DarnerRussell Turner, attest that this documentation has been prepared under the direction and in the presence of non-physician practitioner, Wynetta EmeryNicole Taylynn Easton, PA-C. Electronically Signed: Linna Darnerussell Turner, Scribe. 01/26/2016. 4:16 PM.   Chief Complaint  Patient presents with  . Rash    The history is provided by the patient. No language interpreter was used.     HPI Comments: [name] is a 50 y.o. female who presents to the Emergency Department complaining of sudden onset, constant, generalized, pruritic and painful rash for the last two weeks. She notes that her rashes will spread to different places on her body; it is currently spread over her bilateral arms, scalp, face, back, and buttocks. She states that her rash causes significant pain and soreness wherever it presents. Pt has not used new soaps, lotions, detergents, or medications recently. She is not allergic to any medications to her knowledge. Pt is not diabetic. Pt has not been around anyone with a rash. Pt denies fever or any other associated symptoms.  Past Medical History  Diagnosis Date  . Hepatitis B   . Sickle cell trait (HCC)   . History of liver failure     r/t tylenol  . History of ESBL E. coli infection    Past Surgical History  Procedure Laterality Date  . Wisdom tooth extraction    . Incision and drainage intra oral abscess    . Open reduction internal fixation (orif) metacarpal Right 07/10/2013    Procedure: OPEN REDUCTION INTERNAL FIXATION (ORIF) METACARPAL RIGHT SMALL FINGER ;  Surgeon: Dominica SeverinWilliam Gramig, MD;  Location: [facility] OR;  Service: Orthopedics;  Laterality: Right;  . Tubal ligation    . Hardware removal Right 12/22/2013    Procedure: RIGHT HAND HARDWARE REMOVAL, TENOLYSIS/TENOSYNOVECTOMY;  Surgeon: Dominica SeverinWilliam Gramig, MD;  Location: [facility] OR;  Service: Orthopedics;  Laterality: Right;   Family History  Problem Relation Age  of Onset  . Cancer Other   . Diabetes Other    Social History  Substance Use Topics  . Smoking status: Current Some Day Smoker -- 0.15 packs/day for 30 years  . Smokeless tobacco: None     Comment: Still intermittently smoking, was up to half pack a day  . Alcohol Use: Yes     Comment: on holiday's   OB History    No data available     Review of Systems  A complete 10 system review of systems was obtained and all systems are negative except as noted in the HPI and PMH.   Allergies  Acetaminophen and Peroxide  Home Medications   Prior to Admission medications   Medication Sig Start Date End Date Taking? Authorizing Provider  Biotin 1000 MCG tablet Take 1,000 mcg by mouth 3 (three) times daily.     Historical Provider, MD  cephALEXin (KEFLEX) 500 MG capsule Take 1 capsule (500 mg total) by mouth 4 (four) times daily. 06/15/15   Emilia BeckKaitlyn Szekalski, PA-C  Cholecalciferol (VITAMIN D PO) Take 1 tablet by mouth 3 (three) times daily.    Historical Provider, MD  fish oil-omega-3 fatty acids 1000 MG capsule Take 1 g by mouth daily.     Historical Provider, MD  HYDROcodone-ibuprofen (VICOPROFEN) 7.5-200 MG per tablet Take 1 tablet by mouth every 6 (six) hours as needed for moderate pain. 06/15/15   Kaitlyn Szekalski, PA-C  ibuprofen (ADVIL,MOTRIN) 600 MG tablet Take 600 mg by mouth every  6 (six) hours as needed.    Historical Provider, MD  Milk Thistle 1000 MG CAPS Take 2 capsules by mouth 3 (three) times daily with meals.    Historical Provider, MD  naproxen (NAPROSYN) 500 MG tablet Take 1 tablet (500 mg total) by mouth 2 (two) times daily. 10/21/15   Ace Gins Sam, PA-C  oxyCODONE (OXY IR/ROXICODONE) 5 MG immediate release tablet Take 5 mg by mouth every 6 (six) hours as needed for severe pain.    Historical Provider, MD  oxycodone (OXY-IR) 5 MG capsule Take 1 capsule (5 mg total) by mouth every 4 (four) hours as needed. 12/22/13   Dominica Severin, MD  phenol (CHLORASEPTIC) 1.4 % LIQD Use as  directed 2 sprays in the mouth or throat as needed for throat irritation / pain. 10/21/15   Ace Gins Sam, PA-C  predniSONE (DELTASONE) 20 MG tablet Take 2 tablets (40 mg total) by mouth daily. Take 40 mg by mouth daily for 3 days, then  by mouth daily for 3 days, then  daily for 3 days 11/24/13   Garlon Hatchet, PA-C  vitamin B-12 (CYANOCOBALAMIN) 500 MCG tablet Take 500 mcg by mouth 3 (three) times daily.     Historical Provider, MD  vitamin C (ASCORBIC ACID) 500 MG tablet Take 500 mg by mouth 3 (three) times daily.    Historical Provider, MD  vitamin E 1000 UNIT capsule Take 1,000 Units by mouth 3 (three) times daily.    Historical Provider, MD   BP 120/81 mmHg  Pulse 102  Temp(Src) 98.3 F (36.8 C) (Oral)  Resp 18  SpO2 96%  LMP 01/14/2016 (Exact Date) Physical Exam  Constitutional: She is oriented to person, place, and time. She appears well-developed and well-nourished. No distress.  HENT:  Head: Normocephalic and atraumatic.  Eyes: Conjunctivae and EOM are normal.  Neck: Neck supple. No tracheal deviation present.  Cardiovascular: Normal rate.   Pulmonary/Chest: Effort normal. No respiratory distress.  Musculoskeletal: Normal range of motion.  Neurological: She is alert and oriented to person, place, and time.  Skin: Skin is warm and dry. Rash noted.  Hive -like lesions 4 extremities.   No warmth, TTP or discharge. Lesions are blanchable; sparing palms, soles and mucous membranes   Psychiatric: She has a normal mood and affect. Her behavior is normal.  Nursing note and vitals reviewed.   ED Course  Procedures (including critical care time)  DIAGNOSTIC STUDIES: Oxygen Saturation is 96% on RA, adequate by my interpretation.    COORDINATION OF CARE: 4:16 PM Discussed treatment plan with pt at bedside and pt agreed to plan.  Labs Review Labs Reviewed - No data to display  Imaging Review No results found. I have personally reviewed and evaluated these images and  lab results as part of my medical decision-making.   EKG Interpretation None      MDM   Final diagnoses:  Hives    Filed Vitals:   01/26/16 1438  BP: 120/81  Pulse: 102  Temp: 98.3 F (36.8 C)  TempSrc: Oral  Resp: 18  SpO2: 96%     [name] is 50 y.o. female presenting with pruritic rash for 2 weeks, no new environmental exposures, lesions are blanchable and spare the palms soles or mucous membranes, consistent with hives. Patient given prescription for prednisone however, states that her car just got tone and she needs to leave immediately, she left without being formally discharge him without her paperwork.  Evaluation does not show pathology that  would require ongoing emergent intervention or inpatient treatment. Pt is hemodynamically stable and mentating appropriately. Discussed findings and plan with patient/guardian, who agrees with care plan. All questions answered. Return precautions discussed and outpatient follow up given.       Wynetta Emery, PA-C 01/26/16 2029  Azalia Bilis, MD 01/27/16 [name]

## 2016-05-09 ENCOUNTER — Encounter (HOSPITAL_COMMUNITY): Payer: Self-pay

## 2016-05-09 ENCOUNTER — Emergency Department (HOSPITAL_COMMUNITY): Payer: Self-pay

## 2016-05-09 ENCOUNTER — Emergency Department (HOSPITAL_COMMUNITY)
Admission: EM | Admit: 2016-05-09 | Discharge: 2016-05-09 | Disposition: A | Discharge status: Home / Self Care | Payer: Self-pay | Attending: Emergency Medicine | Admitting: Emergency Medicine

## 2016-05-09 DIAGNOSIS — Y9389 Activity, other specified: Secondary | ICD-10-CM | POA: Insufficient documentation

## 2016-05-09 DIAGNOSIS — S81811A Laceration without foreign body, right lower leg, initial encounter: Secondary | ICD-10-CM

## 2016-05-09 DIAGNOSIS — S81831A Puncture wound without foreign body, right lower leg, initial encounter: Secondary | ICD-10-CM | POA: Insufficient documentation

## 2016-05-09 DIAGNOSIS — W268XXA Contact with other sharp object(s), not elsewhere classified, initial encounter: Secondary | ICD-10-CM | POA: Insufficient documentation

## 2016-05-09 DIAGNOSIS — S93401A Sprain of unspecified ligament of right ankle, initial encounter: Secondary | ICD-10-CM

## 2016-05-09 DIAGNOSIS — F17200 Nicotine dependence, unspecified, uncomplicated: Secondary | ICD-10-CM | POA: Insufficient documentation

## 2016-05-09 DIAGNOSIS — Y999 Unspecified external cause status: Secondary | ICD-10-CM | POA: Insufficient documentation

## 2016-05-09 DIAGNOSIS — Y929 Unspecified place or not applicable: Secondary | ICD-10-CM | POA: Insufficient documentation

## 2016-05-09 MED ORDER — LIDOCAINE HCL (PF) 1 % IJ SOLN
5.0000 mL | Freq: Once | INTRAMUSCULAR | Status: AC
  Administered 2016-05-09: 5 mL
  Filled 2016-05-09: qty 5

## 2016-05-09 MED ORDER — OXYCODONE HCL 5 MG PO TABS
5.0000 mg | ORAL_TABLET | Freq: Once | ORAL | Status: AC
  Administered 2016-05-09: 5 mg via ORAL
  Filled 2016-05-09: qty 1

## 2016-05-09 MED ORDER — TRAMADOL HCL 50 MG PO TABS: 50.0000 mg | ORAL_TABLET | Freq: Two times a day (BID) | ORAL | 0 refills | Status: DC

## 2016-05-09 MED ORDER — BACITRACIN ZINC 500 UNIT/GM EX OINT
TOPICAL_OINTMENT | Freq: Two times a day (BID) | CUTANEOUS | Status: DC
  Administered 2016-05-09: 23:00:00 via TOPICAL
  Filled 2016-05-09: qty 1.8

## 2016-05-09 MED ORDER — SULFAMETHOXAZOLE-TRIMETHOPRIM 800-160 MG PO TABS: 1.0000 | ORAL_TABLET | Freq: Two times a day (BID) | ORAL | 0 refills | Status: AC

## 2016-05-09 NOTE — ED Triage Notes (Signed)
Pt states fell down steps, pt struck R leg on nails sticking out of deck. Pt states "blood squirting everywhere." Pt with no obvious bleeding at triage. Pt anxious at triage. Pt states no feeling in R foot, pt able to move toes well. Cap refill < 3s.

## 2016-05-09 NOTE — ED Provider Notes (Signed)
[facility]-EMERGENCY DEPT Provider Note   CSN: [id number] Arrival date & time: 05/09/16  2022  First Provider Contact:   First MD Initiated Contact with Patient 05/09/16 2040       By signing my name below, I, Placido SouLogan Joldersma, attest that this documentation has been prepared under the direction and in the presence of Kerrie BuffaloHope Egor Fullilove, NP. Electronically Signed: Placido SouLogan Joldersma, ED Scribe. 05/09/16. 8:49 PM.    History   Chief Complaint Chief Complaint  Patient presents with  . Extremity Laceration    HPI HPI Comments: [name] is a 50 y.o. female who presents to the Emergency Department complaining of multiple puncture wounds to her right lateral calf with no active bleeding that occurred PTA. Pt states she has a PMHx of a bulging disk and experienced BLE weakness while ambulating on her deck causing her to fall and her right lower leg to strike multiple exposed metal nails causing her wounds. She reports associated, moderate, pain across the region with mild swelling. She denies her tetanus vaccination is UTD. Pt denies head trauma, LOC or any other associated symptoms at this time.    The history is provided by the patient. No language interpreter was used.  Laceration   The incident occurred less than 1 hour ago. The laceration is located on the right leg. The laceration mechanism was a a metal edge. The pain is moderate. The pain has been constant since onset. Her tetanus status is out of date.    Past Medical History:  Diagnosis Date  . Hepatitis B   . History of ESBL E. coli infection   . History of liver failure    r/t tylenol  . Sickle cell trait [facility](HCC)     Patient Active Problem List   Diagnosis Date Noted  . HYPOKALEMIA 05/13/2007  . RUQ PAIN 05/13/2007  . HEPATITIS B 02/01/2007  . PINGUECULA 02/01/2007  . LYMPHEDEMA 02/01/2007  . NECROSIS, ACUTE, LIVER 02/01/2007  . RENAL FAILURE 02/01/2007  . HEPATITIS B, HX OF 02/01/2007    Past Surgical History:  Procedure  Laterality Date  . HARDWARE REMOVAL Right 12/22/2013   Procedure: RIGHT HAND HARDWARE REMOVAL, TENOLYSIS/TENOSYNOVECTOMY;  Surgeon: Dominica SeverinWilliam Gramig, MD;  Location: [facility] OR;  Service: Orthopedics;  Laterality: Right;  . INCISION AND DRAINAGE INTRA ORAL ABSCESS    . OPEN REDUCTION INTERNAL FIXATION (ORIF) METACARPAL Right 07/10/2013   Procedure: OPEN REDUCTION INTERNAL FIXATION (ORIF) METACARPAL RIGHT SMALL FINGER ;  Surgeon: Dominica SeverinWilliam Gramig, MD;  Location: [facility] OR;  Service: Orthopedics;  Laterality: Right;  . TUBAL LIGATION    . WISDOM TOOTH EXTRACTION      OB History    No data available       Home Medications    Prior to Admission medications   Medication Sig Start Date End Date Taking? Authorizing Provider  Biotin 1000 MCG tablet Take 1,000 mcg by mouth 3 (three) times daily.     Historical Provider, MD  cephALEXin (KEFLEX) 500 MG capsule Take 1 capsule (500 mg total) by mouth 4 (four) times daily. 06/15/15   Emilia BeckKaitlyn Szekalski, PA-C  Cholecalciferol (VITAMIN D PO) Take 1 tablet by mouth 3 (three) times daily.    Historical Provider, MD  fish oil-omega-3 fatty acids 1000 MG capsule Take 1 g by mouth daily.     Historical Provider, MD  HYDROcodone-ibuprofen (VICOPROFEN) 7.5-200 MG per tablet Take 1 tablet by mouth every 6 (six) hours as needed for moderate pain. 06/15/15   Emilia BeckKaitlyn Szekalski, PA-C  ibuprofen (ADVIL,MOTRIN)  600 MG tablet Take 600 mg by mouth every 6 (six) hours as needed.    Historical Provider, MD  Milk Thistle 1000 MG CAPS Take 2 capsules by mouth 3 (three) times daily with meals.    Historical Provider, MD  naproxen (NAPROSYN) 500 MG tablet Take 1 tablet (500 mg total) by mouth 2 (two) times daily. 10/21/15   Ace Gins Sam, PA-C  oxyCODONE (OXY IR/ROXICODONE) 5 MG immediate release tablet Take 5 mg by mouth every 6 (six) hours as needed for severe pain.    Historical Provider, MD  oxycodone (OXY-IR) 5 MG capsule Take 1 capsule (5 mg total) by mouth every 4 (four) hours as needed.  12/22/13   Dominica Severin, MD  phenol (CHLORASEPTIC) 1.4 % LIQD Use as directed 2 sprays in the mouth or throat as needed for throat irritation / pain. 10/21/15   Ace Gins Sam, PA-C  predniSONE (DELTASONE) 50 MG tablet Take 1 tablet daily with breakfast 01/26/16   Joni Reining Pisciotta, PA-C  sulfamethoxazole-trimethoprim (BACTRIM DS,SEPTRA DS) 800-160 MG tablet Take 1 tablet by mouth 2 (two) times daily. 05/09/16 05/16/16  Khalia Gong Orlene Och, NP  traMADol (ULTRAM) 50 MG tablet Take 1 tablet (50 mg total) by mouth 2 (two) times daily. 05/09/16   Kyion Gautier Orlene Och, NP  vitamin B-12 (CYANOCOBALAMIN) 500 MCG tablet Take 500 mcg by mouth 3 (three) times daily.     Historical Provider, MD  vitamin C (ASCORBIC ACID) 500 MG tablet Take 500 mg by mouth 3 (three) times daily.    Historical Provider, MD  vitamin E 1000 UNIT capsule Take 1,000 Units by mouth 3 (three) times daily.    Historical Provider, MD    Family History Family History  Problem Relation Age of Onset  . Cancer Other   . Diabetes Other     Social History Social History  Substance Use Topics  . Smoking status: Current Some Day Smoker    Packs/day: 0.15    Years: 30.00  . Smokeless tobacco: Never Used     Comment: Still intermittently smoking, was up to half pack a day  . Alcohol use Yes     Comment: on holiday's     Allergies   Acetaminophen and Peroxide [hydrogen peroxide]   Review of Systems Review of Systems  Musculoskeletal: Positive for arthralgias and joint swelling.       Right ankle pain  Skin: Positive for wound. Negative for color change.  Neurological: Negative for syncope.  All other systems reviewed and are negative.  Physical Exam Updated Vital Signs BP 141/90 (BP Location: Left Arm)   Pulse 85   Temp 99.1 F (37.3 C)   Resp 22   Ht 5\' 7"  (1.702 m)   Wt 81.2 kg   SpO2 96%   BMI 28.04 kg/m   Physical Exam  Constitutional: She is oriented to person, place, and time. She appears well-developed and well-nourished.  No distress.  HENT:  Head: Normocephalic and atraumatic.  Eyes: EOM are normal.  Neck: Normal range of motion.  Cardiovascular: Normal rate and intact distal pulses.   Pulmonary/Chest: Effort normal. No respiratory distress.  Musculoskeletal: Normal range of motion. She exhibits edema and tenderness.       Right ankle: She exhibits swelling. She exhibits normal range of motion, no deformity, no laceration and normal pulse. Tenderness. Lateral malleolus tenderness found. Achilles tendon normal.  Right leg: Multiple small puncture lacerations to the right lower leg lateral aspect. Swelling noted to the lateral aspect of  the right ankle and foot. 2+ PT pulse. Circulation intact. Pain with ROM of the right ankle.   Neurological: She is alert and oriented to person, place, and time.  Skin: Skin is warm and dry.  Psychiatric: She has a normal mood and affect. Her behavior is normal.  Nursing note and vitals reviewed.   ED Treatments / Results  X-rays, wound care, pain management Labs  (all labs ordered are listed, but only abnormal results are displayed) Labs Reviewed - No data to display  Radiology Dg Tibia/fibula Right  Result Date: 05/09/2016 CLINICAL DATA:  Puncture wound to the lateral part of the right leg. EXAM: RIGHT TIBIA AND FIBULA - 2 VIEW COMPARISON:  None. FINDINGS: No fracture.  No bone lesion. Knee and ankle joints are normally aligned. No soft tissue mass or convincing hematoma. No radiopaque foreign body. IMPRESSION: 1. No fracture or dislocation. 2. No radiopaque foreign body. Electronically Signed   By: Amie Portland M.D.   On: 05/09/2016 21:27   Dg Ankle Complete Right  Result Date: 05/09/2016 CLINICAL DATA:  Fall. Puncture wound on lateral part of lower tib fib EXAM: RIGHT ANKLE - COMPLETE 3+ VIEW COMPARISON:  None. FINDINGS: No fracture.  No bone lesion. Ankle mortise is normally spaced and aligned. No arthropathic change. No radiopaque foreign bodies. IMPRESSION: No  fracture.  No radiopaque foreign body. Electronically Signed   By: Amie Portland M.D.   On: 05/09/2016 21:28    Procedures .Marland KitchenLaceration Repair Date/Time: 05/09/2016 10:30 PM Performed by: Janne Napoleon Authorized by: Marily Memos   Consent:    Consent obtained:  Verbal   Consent given by:  Patient   Risks discussed:  Infection and pain Anesthesia (see MAR for exact dosages):    Anesthesia method:  Local infiltration   Local anesthetic:  Lidocaine 1% w/o epi Laceration details:    Location:  Leg   Leg location:  R lower leg Exploration:    Wound exploration: wound explored through full range of motion     Wound extent: no foreign bodies/material noted     Contaminated: no   Treatment:    Area cleansed with:  Betadine   Amount of cleaning:  Standard   Irrigation solution:  Sterile saline   Irrigation volume:  50 ccs   Irrigation method:  Syringe   Visualized foreign bodies/material removed: no   Post-procedure details:    Dressing:  Antibiotic ointment   Patient tolerance of procedure:  Tolerated well, no immediate complications Comments:     No sutures due to the mechanism of injury. Puncture lacerations were well irrigated.  Antibiotic ointment, dressing and antibiotics PO. Tetanus updated.      DIAGNOSTIC STUDIES: Oxygen Saturation is 98% on RA, normal by my interpretation.    COORDINATION OF CARE: 8:44 PM Discussed next steps with pt. Pt verbalized understanding and is agreeable with the plan.    Medications Ordered in ED Medications  bacitracin ointment ( Topical Given 05/09/16 2318)  oxyCODONE (Oxy IR/ROXICODONE) immediate release tablet 5 mg (5 mg Oral Given 05/09/16 2050)  lidocaine (PF) (XYLOCAINE) 1 % injection 5 mL (5 mLs Infiltration Given by Other 05/09/16 2301)     Initial Impression / Assessment and Plan / ED Course  I have reviewed the triage vital signs and the nursing notes.  Pertinent imaging results that were available during my care of the  patient were reviewed by me and considered in my medical decision making (see chart for details).  Clinical Course  Puncture wounds occurred < 12 hours prior to visit. Discussed wound care with pt and answered questions. Scrubbed pt's wound with betadine and irrigated with nml saline solution and applied bacitracin and a dressing. Will d/c home with abx and tramadol. Pt is hemodynamically stable with no complaints prior to dc.    I personally performed the services described in this documentation, which was scribed in my presence. The recorded information has been reviewed and is accurate.   Final Clinical Impressions(s) / ED Diagnoses   Final diagnoses:  Leg laceration, right, initial encounter  Ankle sprain, right, initial encounter    New Prescriptions Discharge Medication List as of 05/09/2016 10:33 PM    START taking these medications   Details  sulfamethoxazole-trimethoprim (BACTRIM DS,SEPTRA DS) 800-160 MG tablet Take 1 tablet by mouth 2 (two) times daily., Starting Fri 05/09/2016, Until Fri 05/16/2016, Print    traMADol (ULTRAM) 50 MG tablet Take 1 tablet (50 mg total) by mouth 2 (two) times daily., Starting Fri 05/09/2016, Print         [place], NP 05/10/16 0157    Marily Memos, MD 05/10/16 [id number]

## 2016-11-13 ENCOUNTER — Emergency Department (HOSPITAL_COMMUNITY)
Admission: EM | Admit: 2016-11-13 | Discharge: 2016-11-13 | Disposition: A | Discharge status: Home / Self Care | Payer: No Typology Code available for payment source | Attending: Emergency Medicine | Admitting: Emergency Medicine

## 2016-11-13 ENCOUNTER — Encounter (HOSPITAL_COMMUNITY): Payer: Self-pay | Admitting: Emergency Medicine

## 2016-11-13 DIAGNOSIS — N3090 Cystitis, unspecified without hematuria: Secondary | ICD-10-CM | POA: Insufficient documentation

## 2016-11-13 DIAGNOSIS — F17200 Nicotine dependence, unspecified, uncomplicated: Secondary | ICD-10-CM | POA: Insufficient documentation

## 2016-11-13 DIAGNOSIS — Z79899 Other long term (current) drug therapy: Secondary | ICD-10-CM | POA: Insufficient documentation

## 2016-11-13 LAB — URINALYSIS, ROUTINE W REFLEX MICROSCOPIC
BILIRUBIN URINE: NEGATIVE
GLUCOSE, UA: NEGATIVE mg/dL
Ketones, ur: NEGATIVE mg/dL
NITRITE: NEGATIVE
PROTEIN: NEGATIVE mg/dL
Specific Gravity, Urine: 1.011 (ref 1.005–1.030)
pH: 6 (ref 5.0–8.0)

## 2016-11-13 LAB — POC URINE PREG, ED: PREG TEST UR: NEGATIVE

## 2016-11-13 MED ORDER — MORPHINE SULFATE (PF) 4 MG/ML IV SOLN
4.0000 mg | Freq: Once | INTRAVENOUS | Status: AC
  Administered 2016-11-13: 4 mg via INTRAMUSCULAR
  Filled 2016-11-13: qty 1

## 2016-11-13 MED ORDER — OXYCODONE HCL 5 MG PO TABS: 5.0000 mg | ORAL_TABLET | ORAL | 0 refills | Status: DC | PRN

## 2016-11-13 MED ORDER — PHENAZOPYRIDINE HCL 200 MG PO TABS
200.0000 mg | ORAL_TABLET | Freq: Once | ORAL | Status: AC
  Administered 2016-11-13: 200 mg via ORAL
  Filled 2016-11-13: qty 1

## 2016-11-13 MED ORDER — SULFAMETHOXAZOLE-TRIMETHOPRIM 800-160 MG PO TABS: 1.0000 | ORAL_TABLET | Freq: Two times a day (BID) | ORAL | 0 refills | Status: AC

## 2016-11-13 MED ORDER — IBUPROFEN 600 MG PO TABS: 600.0000 mg | ORAL_TABLET | Freq: Four times a day (QID) | ORAL | 0 refills | Status: DC | PRN

## 2016-11-13 MED ORDER — CEFTRIAXONE SODIUM 1 G IJ SOLR
1.0000 g | Freq: Once | INTRAMUSCULAR | Status: DC
  Administered 2016-11-13: 1 g via INTRAMUSCULAR
  Filled 2016-11-13: qty 10

## 2016-11-13 MED ORDER — CEFTRIAXONE SODIUM 1 G IJ SOLR
1.0000 g | INTRAMUSCULAR | Status: DC
  Filled 2016-11-13: qty 10

## 2016-11-13 MED ORDER — PHENAZOPYRIDINE HCL 200 MG PO TABS: 200.0000 mg | ORAL_TABLET | Freq: Three times a day (TID) | ORAL | 0 refills | Status: DC

## 2016-11-13 MED ORDER — LIDOCAINE HCL 1 % IJ SOLN
INTRAMUSCULAR | Status: AC
  Administered 2016-11-13: 20 mL
  Filled 2016-11-13: qty 20

## 2016-11-13 MED ORDER — STERILE WATER FOR INJECTION IJ SOLN
INTRAMUSCULAR | Status: AC
  Filled 2016-11-13: qty 10

## 2016-11-13 MED ORDER — ONDANSETRON 4 MG PO TBDP
4.0000 mg | ORAL_TABLET | Freq: Once | ORAL | Status: AC
  Administered 2016-11-13: 4 mg via ORAL
  Filled 2016-11-13: qty 1

## 2016-11-13 NOTE — ED Triage Notes (Addendum)
Pt reports dysuria, cloudy urine, and bilateral flank pain for the past 10 days. Had a fever yesterday. No n/v/d.

## 2016-11-13 NOTE — ED Provider Notes (Signed)
[facility]-EMERGENCY DEPT Provider Note   CSN: [id number] Arrival date & time: 11/13/16  1201     History   Chief Complaint Chief Complaint  Patient presents with  . Dysuria    HPI [name] is a 51 y.o. female.  Pt presents to the ED today with an 11 day history of dysuria and cloudy urine.  Pt also has flank pain now.  The pt denies n/v or f/c.      Past Medical History:  Diagnosis Date  . Hepatitis B   . History of ESBL E. coli infection   . History of liver failure    r/t tylenol  . Sickle cell trait [facility])     Patient Active Problem List   Diagnosis Date Noted  . HYPOKALEMIA 05/13/2007  . RUQ PAIN 05/13/2007  . HEPATITIS B 02/01/2007  . PINGUECULA 02/01/2007  . LYMPHEDEMA 02/01/2007  . NECROSIS, ACUTE, LIVER 02/01/2007  . RENAL FAILURE 02/01/2007  . HEPATITIS B, HX OF 02/01/2007    Past Surgical History:  Procedure Laterality Date  . HARDWARE REMOVAL Right 12/22/2013   Procedure: RIGHT HAND HARDWARE REMOVAL, TENOLYSIS/TENOSYNOVECTOMY;  Surgeon: Dominica Severin, MD;  Location: [facility] OR;  Service: Orthopedics;  Laterality: Right;  . INCISION AND DRAINAGE INTRA ORAL ABSCESS    . OPEN REDUCTION INTERNAL FIXATION (ORIF) METACARPAL Right 07/10/2013   Procedure: OPEN REDUCTION INTERNAL FIXATION (ORIF) METACARPAL RIGHT SMALL FINGER ;  Surgeon: Dominica Severin, MD;  Location: [facility] OR;  Service: Orthopedics;  Laterality: Right;  . TUBAL LIGATION    . WISDOM TOOTH EXTRACTION      OB History    No data available       Home Medications    Prior to Admission medications   Medication Sig Start Date End Date Taking? Authorizing Provider  Biotin 1000 MCG tablet Take 1,000 mcg by mouth 3 (three) times daily.     Historical Provider, MD  cephALEXin (KEFLEX) 500 MG capsule Take 1 capsule (500 mg total) by mouth 4 (four) times daily. 06/15/15   Emilia Beck, PA-C  Cholecalciferol (VITAMIN D PO) Take 1 tablet by mouth 3 (three) times daily.    Historical Provider, MD    fish oil-omega-3 fatty acids 1000 MG capsule Take 1 g by mouth daily.     Historical Provider, MD  HYDROcodone-ibuprofen (VICOPROFEN) 7.5-200 MG per tablet Take 1 tablet by mouth every 6 (six) hours as needed for moderate pain. 06/15/15   Kaitlyn Szekalski, PA-C  ibuprofen (ADVIL,MOTRIN) 600 MG tablet Take 1 tablet (600 mg total) by mouth every 6 (six) hours as needed. 11/13/16   Jacalyn Lefevre, MD  Milk Thistle 1000 MG CAPS Take 2 capsules by mouth 3 (three) times daily with meals.    Historical Provider, MD  naproxen (NAPROSYN) 500 MG tablet Take 1 tablet (500 mg total) by mouth 2 (two) times daily. 10/21/15   Ace Gins Sam, PA-C  oxyCODONE (OXY IR/ROXICODONE) 5 MG immediate release tablet Take 1 tablet (5 mg total) by mouth every 4 (four) hours as needed for severe pain. 11/13/16   Jacalyn Lefevre, MD  phenazopyridine (PYRIDIUM) 200 MG tablet Take 1 tablet (200 mg total) by mouth 3 (three) times daily. 11/13/16   Jacalyn Lefevre, MD  phenol (CHLORASEPTIC) 1.4 % LIQD Use as directed 2 sprays in the mouth or throat as needed for throat irritation / pain. 10/21/15   Ace Gins Sam, PA-C  predniSONE (DELTASONE) 50 MG tablet Take 1 tablet daily with breakfast 01/26/16   Wynetta Emery, PA-C  sulfamethoxazole-trimethoprim (BACTRIM DS,SEPTRA DS) 800-160 MG tablet Take 1 tablet by mouth 2 (two) times daily. 11/13/16 11/20/16  Jacalyn LefevreJulie Taevon Aschoff, MD  traMADol (ULTRAM) 50 MG tablet Take 1 tablet (50 mg total) by mouth 2 (two) times daily. 05/09/16   Hope Orlene OchM Neese, NP  vitamin B-12 (CYANOCOBALAMIN) 500 MCG tablet Take 500 mcg by mouth 3 (three) times daily.     Historical Provider, MD  vitamin C (ASCORBIC ACID) 500 MG tablet Take 500 mg by mouth 3 (three) times daily.    Historical Provider, MD  vitamin E 1000 UNIT capsule Take 1,000 Units by mouth 3 (three) times daily.    Historical Provider, MD    Family History Family History  Problem Relation Age of Onset  . Cancer Other   . Diabetes Other     Social  History Social History  Substance Use Topics  . Smoking status: Current Some Day Smoker    Packs/day: 0.15    Years: 30.00  . Smokeless tobacco: Never Used     Comment: Still intermittently smoking, was up to half pack a day  . Alcohol use Yes     Comment: on holiday's     Allergies   Acetaminophen and Peroxide [hydrogen peroxide]   Review of Systems Review of Systems  Genitourinary: Positive for dysuria, frequency, hematuria and urgency.  All other systems reviewed and are negative.    Physical Exam Updated Vital Signs BP 109/82 (BP Location: Left Arm)   Pulse 99   Temp 98.2 F (36.8 C) (Oral)   Resp 18   SpO2 94%   Physical Exam  Constitutional: She is oriented to person, place, and time. She appears well-developed and well-nourished.  HENT:  Head: Normocephalic and atraumatic.  Right Ear: External ear normal.  Left Ear: External ear normal.  Nose: Nose normal.  Mouth/Throat: Oropharynx is clear and moist.  Eyes: Conjunctivae and EOM are normal. Pupils are equal, round, and reactive to light.  Neck: Normal range of motion. Neck supple.  Cardiovascular: Normal rate, regular rhythm, normal heart sounds and intact distal pulses.   Pulmonary/Chest: Effort normal and breath sounds normal.  Abdominal: Soft. Bowel sounds are normal. There is CVA tenderness.  Musculoskeletal: Normal range of motion.  Neurological: She is alert and oriented to person, place, and time.  Skin: Skin is warm and dry.  Psychiatric: She has a normal mood and affect. Her behavior is normal. Judgment and thought content normal.  Nursing note and vitals reviewed.    ED Treatments / Results  Labs (all labs ordered are listed, but only abnormal results are displayed) Labs Reviewed  URINALYSIS, ROUTINE W REFLEX MICROSCOPIC - Abnormal; Notable for the following:       Result Value   APPearance CLOUDY (*)    Hgb urine dipstick SMALL (*)    Leukocytes, UA LARGE (*)    Bacteria, UA MANY (*)     Squamous Epithelial / LPF 0-5 (*)    All other components within normal limits  POC URINE PREG, ED    EKG  EKG Interpretation None       Radiology No results found.  Procedures Procedures (including critical care time)  Medications Ordered in ED Medications  cefTRIAXone (ROCEPHIN) injection 1 g (not administered)  phenazopyridine (PYRIDIUM) tablet 200 mg (not administered)  morphine 4 MG/ML injection 4 mg (not administered)  ondansetron (ZOFRAN-ODT) disintegrating tablet 4 mg (not administered)     Initial Impression / Assessment and Plan / ED Course  I have reviewed  the triage vital signs and the nursing notes.  Pertinent labs & imaging results that were available during my care of the patient were reviewed by me and considered in my medical decision making (see chart for details).    Pt treated with rocephin and pain meds.  She will be d/c with bactrim and pain meds.  Final Clinical Impressions(s) / ED Diagnoses   Final diagnoses:  Cystitis    New Prescriptions New Prescriptions   IBUPROFEN (ADVIL,MOTRIN) 600 MG TABLET    Take 1 tablet (600 mg total) by mouth every 6 (six) hours as needed.   OXYCODONE (OXY IR/ROXICODONE) 5 MG IMMEDIATE RELEASE TABLET    Take 1 tablet (5 mg total) by mouth every 4 (four) hours as needed for severe pain.   PHENAZOPYRIDINE (PYRIDIUM) 200 MG TABLET    Take 1 tablet (200 mg total) by mouth 3 (three) times daily.   SULFAMETHOXAZOLE-TRIMETHOPRIM (BACTRIM DS,SEPTRA DS) 800-160 MG TABLET    Take 1 tablet by mouth 2 (two) times daily.     Jacalyn Lefevre, MD 11/13/16 1426

## 2017-01-15 ENCOUNTER — Encounter (HOSPITAL_COMMUNITY): Payer: Self-pay | Admitting: Emergency Medicine

## 2017-01-15 ENCOUNTER — Emergency Department (HOSPITAL_COMMUNITY)
Admission: EM | Admit: 2017-01-15 | Discharge: 2017-01-15 | Disposition: A | Discharge status: Home / Self Care | Payer: Self-pay | Attending: Emergency Medicine | Admitting: Emergency Medicine

## 2017-01-15 ENCOUNTER — Emergency Department (HOSPITAL_COMMUNITY): Payer: Self-pay

## 2017-01-15 DIAGNOSIS — Y9389 Activity, other specified: Secondary | ICD-10-CM | POA: Insufficient documentation

## 2017-01-15 DIAGNOSIS — F17200 Nicotine dependence, unspecified, uncomplicated: Secondary | ICD-10-CM | POA: Insufficient documentation

## 2017-01-15 DIAGNOSIS — M545 Low back pain, unspecified: Secondary | ICD-10-CM

## 2017-01-15 DIAGNOSIS — W1789XA Other fall from one level to another, initial encounter: Secondary | ICD-10-CM | POA: Insufficient documentation

## 2017-01-15 DIAGNOSIS — Z79899 Other long term (current) drug therapy: Secondary | ICD-10-CM | POA: Insufficient documentation

## 2017-01-15 DIAGNOSIS — Y929 Unspecified place or not applicable: Secondary | ICD-10-CM | POA: Insufficient documentation

## 2017-01-15 DIAGNOSIS — Y999 Unspecified external cause status: Secondary | ICD-10-CM | POA: Insufficient documentation

## 2017-01-15 DIAGNOSIS — S300XXA Contusion of lower back and pelvis, initial encounter: Secondary | ICD-10-CM | POA: Insufficient documentation

## 2017-01-15 LAB — URINALYSIS, ROUTINE W REFLEX MICROSCOPIC
BACTERIA UA: NONE SEEN
Bilirubin Urine: NEGATIVE
Glucose, UA: NEGATIVE mg/dL
Ketones, ur: NEGATIVE mg/dL
Leukocytes, UA: NEGATIVE
Nitrite: NEGATIVE
PROTEIN: NEGATIVE mg/dL
SPECIFIC GRAVITY, URINE: 1.01 (ref 1.005–1.030)
SQUAMOUS EPITHELIAL / LPF: NONE SEEN
pH: 6 (ref 5.0–8.0)

## 2017-01-15 LAB — PREGNANCY, URINE: PREG TEST UR: NEGATIVE

## 2017-01-15 MED ORDER — IBUPROFEN 600 MG PO TABS: 600.0000 mg | ORAL_TABLET | Freq: Four times a day (QID) | ORAL | 0 refills | Status: DC | PRN

## 2017-01-15 MED ORDER — OXYCODONE HCL 5 MG PO TABS: 5.0000 mg | ORAL_TABLET | ORAL | 0 refills | Status: DC | PRN

## 2017-01-15 MED ORDER — OXYCODONE HCL 5 MG PO TABS
10.0000 mg | ORAL_TABLET | Freq: Once | ORAL | Status: AC
  Administered 2017-01-15: 10 mg via ORAL
  Filled 2017-01-15: qty 2
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
## 2017-01-15 NOTE — Discharge Instructions (Signed)
Return if any problems.
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
## 2017-01-17 LAB — URINE CULTURE: Culture: NO GROWTH

## 2018-07-29 ENCOUNTER — Emergency Department (HOSPITAL_COMMUNITY): Payer: Self-pay

## 2018-07-29 ENCOUNTER — Emergency Department (HOSPITAL_COMMUNITY)
Admission: EM | Admit: 2018-07-29 | Discharge: 2018-07-29 | Disposition: A | Discharge status: Home / Self Care | Payer: Self-pay | Attending: Emergency Medicine | Admitting: Emergency Medicine

## 2018-07-29 ENCOUNTER — Encounter (HOSPITAL_COMMUNITY): Payer: Self-pay

## 2018-07-29 ENCOUNTER — Other Ambulatory Visit: Payer: Self-pay

## 2018-07-29 DIAGNOSIS — M25561 Pain in right knee: Secondary | ICD-10-CM

## 2018-07-29 DIAGNOSIS — D573 Sickle-cell trait: Secondary | ICD-10-CM | POA: Insufficient documentation

## 2018-07-29 DIAGNOSIS — F17210 Nicotine dependence, cigarettes, uncomplicated: Secondary | ICD-10-CM | POA: Insufficient documentation

## 2018-07-29 DIAGNOSIS — M79641 Pain in right hand: Secondary | ICD-10-CM

## 2018-07-29 DIAGNOSIS — Z79899 Other long term (current) drug therapy: Secondary | ICD-10-CM | POA: Insufficient documentation

## 2018-07-29 MED ORDER — OXYCODONE HCL 5 MG PO TABS: 5.0000 mg | ORAL_TABLET | Freq: Four times a day (QID) | ORAL | 0 refills | Status: DC | PRN

## 2018-07-29 MED ORDER — OXYCODONE HCL 5 MG PO TABS
5.0000 mg | ORAL_TABLET | Freq: Once | ORAL | Status: AC
  Administered 2018-07-29: 5 mg via ORAL
  Filled 2018-07-29: qty 1
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
## 2018-07-29 NOTE — ED Provider Notes (Signed)
[facility]-EMERGENCY DEPT Provider Note   CSN: [id number] Arrival date & time: 07/29/18  6433     History   Chief Complaint Chief Complaint  Patient presents with  . Hand Pain    HPI [name] is a 52 y.o. female presenting today after fall that occurred at 8 AM this morning.  Patient states that she was walking out of her shower when she slipped falling onto her right knee and right wrist.  Patient denies loss of consciousness or any other injuries.  Patient states that she went to lay down after her fall, took ibuprofen and after multiple hours without relief decided to present to the emergency department.  Patient describes her right wrist pain as a severe constant throbbing pain that is worse on the radial side extending into her right thenar eminence.  Patient states that her pain has not improved following ibuprofen.  Endorses swelling to her right wrist.  Patient denies numbness/tingling to her right wrist or hand.  Patient also endorses right knee pain after her fall she describes her right knee pain as a mild aching pain around the medial joint line.  Patient states that she has been ambulatory but with a slight limp since that time.  Patient denies numbness/tingling to her right lower extremity.  Patient denies swelling to the area.  Patient denies fever, loss conscious, blood thinner use, headache, neck pain, back pain, chest pain/shortness of breath, abdominal pain, nausea/vomiting, numbness/weakness or tingling.  HPI  Past Medical History:  Diagnosis Date  . Hepatitis B   . History of ESBL E. coli infection   . History of liver failure    r/t tylenol  . Sickle cell trait [facility])     Patient Active Problem List   Diagnosis Date Noted  . HYPOKALEMIA 05/13/2007  . RUQ PAIN 05/13/2007  . HEPATITIS B 02/01/2007  . PINGUECULA 02/01/2007  . LYMPHEDEMA 02/01/2007  . NECROSIS, ACUTE, LIVER 02/01/2007  . RENAL FAILURE 02/01/2007  . HEPATITIS  B, HX OF 02/01/2007    Past Surgical History:  Procedure Laterality Date  . HARDWARE REMOVAL Right 12/22/2013   Procedure: RIGHT HAND HARDWARE REMOVAL, TENOLYSIS/TENOSYNOVECTOMY;  Surgeon: Dominica Severin, MD;  Location: [facility] OR;  Service: Orthopedics;  Laterality: Right;  . INCISION AND DRAINAGE INTRA ORAL ABSCESS    . OPEN REDUCTION INTERNAL FIXATION (ORIF) METACARPAL Right 07/10/2013   Procedure: OPEN REDUCTION INTERNAL FIXATION (ORIF) METACARPAL RIGHT SMALL FINGER ;  Surgeon: Dominica Severin, MD;  Location: [facility] OR;  Service: Orthopedics;  Laterality: Right;  . TUBAL LIGATION    . WISDOM TOOTH EXTRACTION       OB History   None      Home Medications    Prior to Admission medications   Medication Sig Start Date End Date Taking? Authorizing Provider  Biotin 1000 MCG tablet Take 1,000 mcg by mouth 3 (three) times daily.     [provider]  fish oil-omega-3 fatty acids 1000 MG capsule Take 1 g by mouth daily.     [provider]  ibuprofen (ADVIL,MOTRIN) 600 MG tablet Take 1 tablet (600 mg total) by mouth every 6 (six) hours as needed. 01/15/17   Elson Areas, PA-C  Milk Thistle 1000 MG CAPS Take 2 capsules by mouth 3 (three) times daily with meals.    [provider]  oxyCODONE (ROXICODONE) 5 MG immediate release tablet Take 1 tablet (5 mg total) by mouth every 6 (six) hours as needed for severe pain. 07/29/18  Harlene Salts A, PA-C  phenazopyridine (PYRIDIUM) 200 MG tablet Take 1 tablet (200 mg total) by mouth 3 (three) times daily. Patient not taking: Reported on 01/15/2017 11/13/16   Jacalyn Lefevre, MD  vitamin B-12 (CYANOCOBALAMIN) 500 MCG tablet Take 500 mcg by mouth 3 (three) times daily.     [provider]  vitamin E 1000 UNIT capsule Take 1,000 Units by mouth 3 (three) times daily.    [provider]    Family History Family History  Problem Relation Age of Onset  . Cancer Other   . Diabetes Other     Social  History Social History   Tobacco Use  . Smoking status: Current Some Day Smoker    Packs/day: 0.15    Years: 30.00    Pack years: 4.50  . Smokeless tobacco: Never Used  . Tobacco comment: Still intermittently smoking, was up to half pack a day  Substance Use Topics  . Alcohol use: Yes    Comment: on holiday's  . Drug use: No     Allergies   Acetaminophen and Peroxide [hydrogen peroxide]   Review of Systems Review of Systems  Constitutional: Negative.  Negative for chills and fever.  Respiratory: Negative.  Negative for shortness of breath.   Cardiovascular: Negative.  Negative for chest pain.  Gastrointestinal: Negative.  Negative for nausea and vomiting.  Musculoskeletal: Positive for arthralgias and joint swelling. Negative for back pain and neck pain.  Skin: Negative.  Negative for color change and wound.  Neurological: Negative.  Negative for weakness, numbness and headaches.   Physical Exam Updated Vital Signs BP (!) 143/99 (BP Location: Left Arm)   Pulse 92   Temp 98.2 F (36.8 C) (Oral)   Resp 19   Ht 5' 7.5" (1.715 m)   Wt 90.3 kg   LMP 07/13/2018   SpO2 95%   BMI 30.71 kg/m   Physical Exam  Constitutional: She is oriented to person, place, and time. She appears well-developed and well-nourished. No distress.  HENT:  Head: Normocephalic and atraumatic.  Right Ear: External ear normal.  Left Ear: External ear normal.  Nose: Nose normal.  Eyes: Pupils are equal, round, and reactive to light. EOM are normal.  Neck: Trachea normal, normal range of motion, full passive range of motion without pain and phonation normal. Neck supple. No tracheal deviation present.  Cardiovascular:  Pulses:      Radial pulses are 2+ on the right side, and 2+ on the left side.       Dorsalis pedis pulses are 2+ on the right side, and 2+ on the left side.       Posterior tibial pulses are 2+ on the right side, and 2+ on the left side.  Pulmonary/Chest: Effort normal. No  respiratory distress.  Abdominal: Soft. There is no tenderness. There is no rebound and no guarding.  Musculoskeletal: Normal range of motion.       Right shoulder: Normal.       Right elbow: Normal.      Right wrist: She exhibits tenderness. She exhibits no swelling, no crepitus and no deformity.       Right hip: Normal. She exhibits normal range of motion, no tenderness, no bony tenderness, no swelling and no deformity.       Left hip: Normal.       Right knee: She exhibits normal range of motion, no swelling, no ecchymosis, no deformity and no erythema. Tenderness found. Medial joint line tenderness noted. No lateral  joint line and no patellar tendon tenderness noted.       Left knee: Normal.       Right ankle: Normal.       Left ankle: Normal.       Cervical back: Normal.       Thoracic back: Normal.       Lumbar back: Normal.       Right hand: She exhibits tenderness and swelling. She exhibits normal capillary refill and no laceration. Normal sensation noted.       Right upper leg: Normal. She exhibits no tenderness, no swelling and no deformity.       Left upper leg: Normal.       Right lower leg: Normal.       Left lower leg: Normal.       Right foot: Normal.       Left foot: Normal.  No midline spinal tenderness palpation.  No crepitus step-off or deformity of the spine.  No paraspinal muscular tenderness to palpation.  No signs of injury to the back.  No signs of injury to the chest or abdomen.  Right Knee:   Appearance normal. No obvious deformity. No skin swelling, erythema, heat, fluctuance or break of the skin.  Mild tenderness to palpation over medial joint line.  Active and passive flexion and extension intact without increase in pain and without crepitus. Negative anterior/poster drawer bilaterally. No varus or valgus laxity or locking. No tenderness to palpation of hips or ankles.Compartments soft. Neurovascularly intact distally to site of injury.  No signs of injury  to the right thigh.  Patient is ambulating in room without assistance.  Right hand: With mild swelling to the thenar eminence.  Skin intact. Fingers appear normal.  Tenderness to palpation over thenar eminence.  Mild snuffbox tenderness to palpation. No tenderness to palpation over flexor sheath.  Finger adduction/abduction intact with 5/5 strength.  Patient with decreased range of motion of thumb due to pain.  All movements of thumb are present however with increased pain. Full active and resisted ROM to flexion/extension at wrist, MCP, PIP and DIP of fingers 2, 3, 4 and 5.  FDS/FDP intact.  Grip strength decreased due to pain.  Compartments soft to touch. Radial artery 2+ with <2sec cap refill in all fingers.  Sensation intact to light-tough in median/ulnar/radial distributions.  No pain or tenderness at the elbow, full flexion and extension without pain.   Neurological: She is alert and oriented to person, place, and time.  Skin: Skin is warm and dry.  Psychiatric: She has a normal mood and affect. Her behavior is normal.   ED Treatments / Results  Labs (all labs ordered are listed, but only abnormal results are displayed) Labs Reviewed - No data to display  EKG None  Radiology Dg Wrist Complete Right  Result Date: 07/29/2018 CLINICAL DATA:  Wrist pain after fall in shower today. EXAM: RIGHT WRIST - COMPLETE 3+ VIEW COMPARISON:  11/24/2013 FINDINGS: Cysts or erosions of the triquetrum and lunate, likely degenerative in etiology. Residual ghost tracks of the fifth metacarpal status post hardware removal. No acute fracture or malalignment of the right wrist is noted. Dedicated view of the scaphoid demonstrates an intact scaphoid. Slight ulnar positive variance which may be contributing to the cystic appearance of the lunate and triquetrum. IMPRESSION: Minor degenerative cysts or erosions of the lunate and triquetrum. This in conjunction with ulnar positive variance may reflect stigmata of  chronic ulnar impaction. No acute fracture. Electronically Signed  By: Tollie Eth M.D.   On: 07/29/2018 19:45   Dg Knee Complete 4 Views Right  Result Date: 07/29/2018 CLINICAL DATA:  Generalized right knee pain after slip and fall in shower. EXAM: RIGHT KNEE - COMPLETE 4+ VIEW COMPARISON:  05/09/2016 FINDINGS: Further progression degenerative joint space narrowing spurring and endplate sclerosis across the lateral femorotibial compartment. No acute displaced fracture or joint effusion is seen. Significant soft tissue swelling. IMPRESSION: Progression of femorotibial degenerative joint space narrowing and minimal spurring more so laterally. No joint effusion or definite fracture. If pain is out of proportion to radiographic findings consider CT. Electronically Signed   By: Tollie Eth M.D.   On: 07/29/2018 19:48   Dg Hand Complete Right  Result Date: 07/29/2018 CLINICAL DATA:  Patient landed on right wrist and hand in the shower. EXAM: RIGHT HAND - COMPLETE 3+ VIEW COMPARISON:  Wrist radiographs 11/24/2013 FINDINGS: There has been interval removal plate and screw fixation of the fifth metacarpal. No new fracture or joint dislocation is identified. Mild degenerative subcortical cystic change or erosions of the triquetrum and lunate are identified. Ghost tracks are noted of the fifth metacarpal. Mild soft tissue swelling of the dorsum of the metacarpophalangeal joints. IMPRESSION: No acute fracture or joint dislocation. Mild dorsal soft tissue swelling at the level of the metacarpophalangeal joints. Electronically Signed   By: Tollie Eth M.D.   On: 07/29/2018 19:43    Procedures Procedures (including critical care time)  Medications Ordered in ED Medications  oxyCODONE (Oxy IR/ROXICODONE) immediate release tablet 5 mg (5 mg Oral Given 07/29/18 2047)     Initial Impression / Assessment and Plan / ED Course  I have reviewed the triage vital signs and the nursing notes.  Pertinent labs &  imaging results that were available during my care of the patient were reviewed by me and considered in my medical decision making (see chart for details).  Clinical Course as of Jul 29 2120  Thu Jul 29, 2018  2106 Patient reassessed, ambulating in room no acute distress talking with friend.  Patient endorses moderate relief of pain following pain medication.  Plan discussed, discharge and orthopedic follow-up.  Patient agrees with plan.   [BM]    Clinical Course User Index [BM] Bill Salinas, PA-C   52 year old female presenting after fall onto right knee and right wrist at 8 AM this morning.  Patient with moderate amount of swelling to radial side of right hand.  After pain medication and ice patient is able to move her right thumb as well as all fingers.  Some decreased range of motion of right thumb due to pain.  There is pain over the right thenar eminence, radiographs did not show acute fracture today however patient was placed in spica splint to protect scaphoid and informed to follow-up with orthopedics as soon as possible for further evaluation.  Patient neurovascularly intact to right upper extremity.  Compartments soft.  Patient's right knee pain is mild today, she is ambulatory without assistance and is able to flex and extend at the knee without severe pain.  Imaging today without acute findings.  Patient's pain is not out of proportion and patient is neurovascularly intact distal to the knee.  Do not believe that further imaging is warranted at this time.  Knee sleeve was provided for patient comfort and patient was encouraged to follow-up with Ortho for continued knee pain.  Compartments of lower extremity soft.  Case discussed and imaging reviewed with Dr. Lockie Mola who  agrees with placement of a thumb spica splint and discharge with orthopedic follow-up and pain control.  Patient afebrile, not tachycardic, not hypotensive well-appearing and in no acute distress at  discharge.  At this time there does not appear to be any evidence of an acute emergency medical condition and the patient appears stable for discharge with appropriate outpatient follow up. Diagnosis was discussed with patient who verbalizes understanding of care plan and is agreeable to discharge. I have discussed return precautions with patient and family at bedside who verbalize understanding of return precautions. Patient strongly encouraged to follow-up with their PCP. All questions answered.   Note: Portions of this report may have been transcribed using voice recognition software. Every effort was made to ensure accuracy; however, inadvertent computerized transcription errors may still be present.   Final Clinical Impressions(s) / ED Diagnoses   Final diagnoses:  Right hand pain  Acute pain of right knee    ED Discharge Orders         Ordered    oxyCODONE (ROXICODONE) 5 MG immediate release tablet  Every 6 hours PRN     07/29/18 2110           Bill Salinas, PA-C 07/29/18 2336    Virgina Norfolk, DO 07/30/18 0119

## 2018-07-29 NOTE — ED Notes (Signed)
Pt declined discharge vital signs, sts her ride is waiting.

## 2018-07-29 NOTE — Discharge Instructions (Addendum)
Please return to the Emergency Department for any new or worsening symptoms or if your symptoms do not improve. Please be sure to follow up with your Primary Care Physician as soon as possible regarding your visit today. If you do not have a Primary Doctor please use the resources below to establish one. Please use rest, ice and elevation to help with your pain and swelling.  Please use the thumb splint provided to help with your pain. You may use a short course of pain medication oxycodone as prescribed for severe pain.  Do not drive or operate heavy machinery will take this medication because it can make you drowsy.  Do not use other sedating medications while taking oxycodone. Your x-rays today did not show acute fractures or dislocation.  However small fractures and/or a ligament/tendon injury it may still be present.  It is very important that you follow-up with the orthopedic specialist as soon as possible for further evaluation.  Contact a health care provider if: Your pain does not get better after a few days of self-care. Your pain gets worse. Your pain affects your ability to do your daily activities. Get help right away if: Your hand becomes warm, red, or swollen. Your hand is numb or tingling. Your hand is extremely swollen or deformed. Your hand or fingers turn white or blue. You cannot move your hand, wrist, or fingers.  Do not take your medicine if  develop an itchy rash, swelling in your mouth or lips, or difficulty breathing.   RESOURCE GUIDE  Chronic Pain Problems: Contact [facility]  [phone] Patients need to be referred by their primary care doctor.  Insufficient Money for Medicine: Contact United Way:  call "211" or [facility] 458-703-9700.  No Primary Care Doctor: Call [facility]  [phone] - can help you locate a primary care doctor that  accepts your insurance, provides certain services, etc. Physician Referral Service-  [phone]  Agencies that provide inexpensive medical care: [facility]  [phone] [facility]  [phone] [facility]  [phone] [facility]  [phone] Planned [facility]  [phone] [facility]  [phone]  Medicaid-accepting [facility] Providers: [facility]- [address]  [phone], Mon-Fri 9am-7pm, Sat 9am-1pm [facility]- [address] [place], [address] [place]  [phone] [facility]- [address] [place]  [phone] [facility]- [address]  [phone] Renaye Rakers- [address] [place], [address], [phone]  Only accepts [place] patients after they have their name  applied to their card  Self Pay (no insurance) in [facility]: Sickle Cell Patients: Dr Willey Blade, [facility]  [address] [place], [phone] [facility]- [address] [place]  [phone]       [facility] [place]- [address]       -     [facility]- see information above (Speak to [employer] if you do not have insurance)       -  [facility]- [address] [place], [phone]       -  [facility]- [address],  [phone]       -  [facility]- [address], [phone]       -  Dr Julio Sicks-  [address], [place], [phone]       -  [facility]- [address], [phone]       -  [facility]- [address] [phone], also [address] [phone]       -    [facility]- [address] [place], [phone], 1st & 3rd Saturday   every month, 10am-1pm  1) Find a Doctor and Pay Out of Pocket Although you won't have to find out who is covered by your insurance plan, it is a good idea to ask around and get recommendations. You will then need to call the office and see if the doctor you  have chosen will accept you as a new patient and what types of options they offer for patients who are self-pay. Some doctors offer discounts or will set up payment plans for their patients who do not have insurance, but you will need to ask so you aren't surprised when you get to your appointment.  2) Contact Your Local Health Department Not all health departments have doctors that can see patients for sick visits, but many do, so it is worth a call to see if yours does. If you don't know where your local health department is, you can check in your phone book. The CDC also has a tool to help you locate your state's health department, and many state websites also have listings of all of their local health departments.  3) Find a Walk-in Clinic If your illness is not likely to be very severe or complicated, you may want to try a walk in clinic. These are popping up all over the country in pharmacies, drugstores, and shopping centers. They're usually staffed by nurse practitioners or physician assistants that have been trained to treat common illnesses and complaints. They're usually fairly quick and inexpensive. However, if you have serious medical issues or chronic medical problems, these are probably not your best option  STD Testing [facility] Department of [facility], STD Clinic, [address], [place], phone [phone] or [phone].  Monday - Friday, call for an appointment. [facility] Department of [employer], STD Clinic, [place] [address], [place], phone [phone] or [phone].  Monday - Friday, call for an appointment.  Abuse/Neglect: [facility] Child Abuse Hotline [phone] [facility] Child Abuse Hotline 6410492682 (After Hours)  Emergency Shelter:  [facility] [phone]  Maternity Homes: [facility] 404-695-9441 [facility] [phone]  MRSA Hotline #:    (828)104-1992  [facility] Resources  [facility] [place]  United Way [facility] Dept. [address]                 [address]         [address]  [place]                                               Cristobal Goldmann Phone:  [phone]  Phone:  [phone]                   Phone:  [place], [place]- [phone]       -     [facility] in [place], [address]                                  [place] [phone] or [phone] (After Hours)   [place]  Substance Abuse Resources: Alcohol and Drug Services  360-205-1511 [place] 2504862001 [facility] [place] Pester 501 425 3739 Residential & Outpatient Substance Abuse Program  [phone]  Psychological Services: [place]  [phone] [place]  [place], [place]. [address] [place], [place]: 6698495918 or [phone], [employer]  Dental Assistance  If unable to pay or uninsured, contact:  [facility] or [facility]. to become qualified for the adult dental clinic.  Patients with Medicaid: [facility] [phone] [address], [place] [address], [phone]  If unable to pay, or uninsured, contact [facility] [phone]) or [employer] 6235274445 in [place], [place] in [facility]) to become qualified for the adult dental clinic   Other [place]- [place], [place], [place], [address], [place], Start, 2nd and 4th Thursday of the month at 6:30am.  10 clients each day  by appointment, can sometimes see walk-in patients if someone does not show for an appointment. [facility]- [address] [place], [place], [address], [place], [place], [place], [address], [place] Department- [phone] [facility] [phone]

## 2018-10-03 ENCOUNTER — Other Ambulatory Visit: Payer: Self-pay

## 2018-10-03 ENCOUNTER — Encounter (HOSPITAL_COMMUNITY): Payer: Self-pay | Admitting: Obstetrics and Gynecology

## 2018-10-03 ENCOUNTER — Emergency Department (HOSPITAL_COMMUNITY)
Admission: EM | Admit: 2018-10-03 | Discharge: 2018-10-03 | Disposition: A | Discharge status: Home / Self Care | Payer: Medicaid Other | Attending: Emergency Medicine | Admitting: Emergency Medicine

## 2018-10-03 DIAGNOSIS — H7292 Unspecified perforation of tympanic membrane, left ear: Secondary | ICD-10-CM

## 2018-10-03 DIAGNOSIS — S0922XA Traumatic rupture of left ear drum, initial encounter: Secondary | ICD-10-CM | POA: Insufficient documentation

## 2018-10-03 DIAGNOSIS — F17210 Nicotine dependence, cigarettes, uncomplicated: Secondary | ICD-10-CM | POA: Insufficient documentation

## 2018-10-03 DIAGNOSIS — X58XXXA Exposure to other specified factors, initial encounter: Secondary | ICD-10-CM | POA: Insufficient documentation

## 2018-10-03 DIAGNOSIS — Y939 Activity, unspecified: Secondary | ICD-10-CM | POA: Insufficient documentation

## 2018-10-03 DIAGNOSIS — Y999 Unspecified external cause status: Secondary | ICD-10-CM | POA: Insufficient documentation

## 2018-10-03 DIAGNOSIS — Y929 Unspecified place or not applicable: Secondary | ICD-10-CM | POA: Insufficient documentation

## 2018-10-03 DIAGNOSIS — Z79899 Other long term (current) drug therapy: Secondary | ICD-10-CM | POA: Insufficient documentation

## 2018-10-03 MED ORDER — OXYCODONE HCL 5 MG PO TABS
5.0000 mg | ORAL_TABLET | Freq: Once | ORAL | Status: AC
  Administered 2018-10-03: 5 mg via ORAL
  Filled 2018-10-03: qty 1

## 2018-10-03 MED ORDER — CIPROFLOXACIN-DEXAMETHASONE 0.3-0.1 % OT SUSP: 4.0000 [drp] | Freq: Two times a day (BID) | OTIC | 0 refills | Status: AC

## 2018-10-03 MED ORDER — OXYCODONE HCL 5 MG PO TABS: 5.0000 mg | ORAL_TABLET | Freq: Four times a day (QID) | ORAL | 0 refills | Status: AC | PRN

## 2018-10-03 NOTE — ED Triage Notes (Signed)
Pt reports she was pushing a earplug into her left ear last night with a little stick and believes she punctured her eardrum. She reports she has had serosanguinous fluid coming from the ear. Pt reports pain 10/10

## 2018-10-03 NOTE — ED Provider Notes (Signed)
[facility]-EMERGENCY DEPT Provider Note   CSN: [id number] Arrival date & time: 10/03/18  1746   History   Chief Complaint Chief Complaint  Patient presents with  . Otalgia    HPI [name] is a 53 y.o. female with medical history significant for hepatitis B who presents for evaluation of left ear pain.  Patient states she was pushing her earplugs into her ears yesterday evening when the plastic device that she uses to push it in slipped down the side of the ear plug and hit her eardrum.  Patient states she noticed immediate pain to that area.  Patient states she had intermittent bleeding to the left ear.  Last episode of bleeding at approximately 8 AM this morning.  Patient states she has pain which she rates a 10/10.  Pain does not radiate.  Not take anything for symptoms PTA.  Pain is constant in nature.  Denies hearing loss.  History obtained from patient.  No interpreter was used.  HPI  Past Medical History:  Diagnosis Date  . Hepatitis B   . History of ESBL E. coli infection   . History of liver failure    r/t tylenol  . Sickle cell trait [facility])     Patient Active Problem List   Diagnosis Date Noted  . HYPOKALEMIA 05/13/2007  . RUQ PAIN 05/13/2007  . HEPATITIS B 02/01/2007  . PINGUECULA 02/01/2007  . LYMPHEDEMA 02/01/2007  . NECROSIS, ACUTE, LIVER 02/01/2007  . RENAL FAILURE 02/01/2007  . HEPATITIS B, HX OF 02/01/2007    Past Surgical History:  Procedure Laterality Date  . HARDWARE REMOVAL Right 12/22/2013   Procedure: RIGHT HAND HARDWARE REMOVAL, TENOLYSIS/TENOSYNOVECTOMY;  Surgeon: Dominica Severin, MD;  Location: [facility] OR;  Service: Orthopedics;  Laterality: Right;  . INCISION AND DRAINAGE INTRA ORAL ABSCESS    . OPEN REDUCTION INTERNAL FIXATION (ORIF) METACARPAL Right 07/10/2013   Procedure: OPEN REDUCTION INTERNAL FIXATION (ORIF) METACARPAL RIGHT SMALL FINGER ;  Surgeon: Dominica Severin, MD;  Location: [facility] OR;  Service: Orthopedics;   Laterality: Right;  . TUBAL LIGATION    . WISDOM TOOTH EXTRACTION       OB History   No obstetric history on file.      Home Medications    Prior to Admission medications   Medication Sig Start Date End Date Taking? Authorizing Provider  Biotin 1000 MCG tablet Take 1,000 mcg by mouth 3 (three) times daily.     [provider]  ciprofloxacin-dexamethasone (CIPRODEX) OTIC suspension Place 4 drops into the left ear 2 (two) times daily for 7 days. 10/03/18 10/10/18  Karlis Cregg A, PA-C  fish oil-omega-3 fatty acids 1000 MG capsule Take 1 g by mouth daily.     [provider]  ibuprofen (ADVIL,MOTRIN) 600 MG tablet Take 1 tablet (600 mg total) by mouth every 6 (six) hours as needed. 01/15/17   Elson Areas, PA-C  Milk Thistle 1000 MG CAPS Take 2 capsules by mouth 3 (three) times daily with meals.    [provider]  oxyCODONE (ROXICODONE) 5 MG immediate release tablet Take 1 tablet (5 mg total) by mouth every 6 (six) hours as needed for up to 3 days for severe pain. 10/03/18 10/06/18  Nirvan Laban A, PA-C  phenazopyridine (PYRIDIUM) 200 MG tablet Take 1 tablet (200 mg total) by mouth 3 (three) times daily. Patient not taking: Reported on 01/15/2017 11/13/16   Jacalyn Lefevre, MD  vitamin B-12 (CYANOCOBALAMIN) 500 MCG tablet Take 500 mcg by mouth  3 (three) times daily.     [provider]  vitamin E 1000 UNIT capsule Take 1,000 Units by mouth 3 (three) times daily.    [provider]    Family History Family History  Problem Relation Age of Onset  . Cancer Other   . Diabetes Other     Social History Social History   Tobacco Use  . Smoking status: Current Some Day Smoker    Packs/day: 0.15    Years: 30.00    Pack years: 4.50  . Smokeless tobacco: Never Used  . Tobacco comment: Still intermittently smoking, was up to half pack a day  Substance Use Topics  . Alcohol use: Yes    Comment: on holiday's  . Drug use: No      Allergies   Acetaminophen and Peroxide [hydrogen peroxide]   Review of Systems Review of Systems  Constitutional: Negative.   HENT: Positive for ear discharge and ear pain. Negative for congestion, dental problem, drooling, facial swelling, hearing loss, mouth sores, nosebleeds, postnasal drip, rhinorrhea, sinus pressure, sinus pain, sneezing, sore throat, tinnitus, trouble swallowing and voice change.   Respiratory: Negative.   Cardiovascular: Negative.   Gastrointestinal: Negative.   Genitourinary: Negative.   Musculoskeletal: Negative.   Skin: Negative.   Neurological: Negative.   All other systems reviewed and are negative.    Physical Exam Updated Vital Signs BP 140/90 (BP Location: Right Arm)   Pulse 90   Temp 97.8 F (36.6 C) (Oral)   Resp 18   Ht 5\' 7"  (1.702 m)   Wt 87.5 kg   LMP 09/25/2018 (Exact Date)   SpO2 99%   BMI 30.23 kg/m   Physical Exam Vitals signs and nursing note reviewed.  Constitutional:      General: She is not in acute distress.    Appearance: She is well-developed. She is not ill-appearing, toxic-appearing or diaphoretic.  HENT:     Head: Normocephalic and atraumatic.     Right Ear: Hearing, tympanic membrane, ear canal and external ear normal. No drainage, swelling or tenderness. No middle ear effusion. There is no impacted cerumen. Tympanic membrane is not injected, scarred, perforated, erythematous, retracted or bulging.     Left Ear: Hearing and ear canal normal.     Ears:     Comments: Dried blood to external ear canal.  No decreased hearing.  No evidence of laceration.  No active drainage.  No tenderness with palpation of tragus or pinna.  Patient does have perforated TM in the left.  No active bleeding.  Normal right TM.    Nose: Nose normal. No congestion or rhinorrhea.     Right Sinus: No maxillary sinus tenderness or frontal sinus tenderness.     Left Sinus: No maxillary sinus tenderness or frontal sinus tenderness.      Mouth/Throat:     Mouth: Mucous membranes are moist.     Pharynx: Oropharynx is clear.     Comments: Posterior oropharynx without erythema or exudate.  Uvula midline.  Phonation normal.  No oral lesions.  Mucous membranes moist. Eyes:     Pupils: Pupils are equal, round, and reactive to light.  Neck:     Musculoskeletal: Normal range of motion.     Comments: No stiffness or rigidity. Cardiovascular:     Rate and Rhythm: Normal rate.     Pulses: Normal pulses.     Heart sounds: Normal heart sounds.  Pulmonary:     Effort: Pulmonary effort is normal. No respiratory  distress.     Breath sounds: Normal breath sounds and air entry.     Comments: Clear to auscultation bilateral without wheeze, rhonchi or rales. Abdominal:     General: There is no distension.     Comments: Abdomen soft, nontender without rebound or guarding.  Musculoskeletal: Normal range of motion.     Comments: Moves all extremities without difficulty.  Patient is ambulatory in room without difficulty.  Skin:    General: Skin is warm and dry.  Neurological:     Mental Status: She is alert.      ED Treatments / Results  Labs (all labs ordered are listed, but only abnormal results are displayed) Labs Reviewed - No data to display  EKG None  Radiology No results found.  Procedures Procedures (including critical care time)  Medications Ordered in ED Medications  oxyCODONE (Oxy IR/ROXICODONE) immediate release tablet 5 mg (5 mg Oral Given 10/03/18 1858)     Initial Impression / Assessment and Plan / ED Course  I have reviewed the triage vital signs and the nursing notes.  Pertinent labs & imaging results that were available during my care of the patient were reviewed by me and considered in my medical decision making (see chart for details).  53 year old female who appears as well presents for evaluation of left ear pain.  Patient was putting earplugs in her ears last night when the device that she was  using slipped and went into her ear canal.  Patient noticed immediate pain as well as bleeding.  Bleeding stopped PTA, however patient has 10/10 pain to her left ear.  No decreased hearing.  Right TM normal.  Patient does have dried blood to her left external ear canal.  No evidence of laceration.  She does have a perforated tympanic membrane on left. No evidence of active infection on exam. Afebrile, nonseptic, non-ill-appearing.  Will plan for Cipro eardrops as well as pain management and have patient follow-up with PCP or ENT if she continues to have symptoms.  Patient is hypertensive in department.  She does have history of hypertension, but does not take any medications for this.  She is asymptomatic without headache, vision changes, chest pain, shortness of breath, nausea or vomiting.  Discussed follow-up with PCP for reevaluation of her blood pressure as well as her ear pain.  Patient is hemodynamically stable and appropriate for DC home at this time.  Low suspicion for emergent pathology at this time.  I have discussed return precautions.  Patient voiced understanding and is agreeable for follow-up.    Final Clinical Impressions(s) / ED Diagnoses   Final diagnoses:  Ruptured tympanic membrane, left    ED Discharge Orders         Ordered    ciprofloxacin-dexamethasone (CIPRODEX) OTIC suspension  2 times daily     10/03/18 1946    oxyCODONE (ROXICODONE) 5 MG immediate release tablet  Every 6 hours PRN     10/03/18 1946           Rosabella Edgin A, PA-C 10/03/18 2038    Gerhard MunchLockwood, Robert, MD 10/04/18 0005

## 2018-10-03 NOTE — Discharge Instructions (Addendum)
Evaluated today for left ear pain.  You do have what appears to be a ruptured eardrum.  I have prescribed you pain medicine as well as eardrops.  Please follow-up with your PCP or ear nose and throat if you continue to have symptoms.  Return to the ED for any worsening symptoms.

## 2018-12-06 ENCOUNTER — Encounter (HOSPITAL_COMMUNITY): Payer: Self-pay

## 2018-12-06 ENCOUNTER — Emergency Department (HOSPITAL_COMMUNITY)
Admission: EM | Admit: 2018-12-06 | Discharge: 2018-12-06 | Disposition: A | Discharge status: Home / Self Care | Payer: Self-pay | Attending: Emergency Medicine | Admitting: Emergency Medicine

## 2018-12-06 ENCOUNTER — Emergency Department (HOSPITAL_COMMUNITY): Payer: Self-pay

## 2018-12-06 ENCOUNTER — Other Ambulatory Visit: Payer: Self-pay

## 2018-12-06 DIAGNOSIS — N10 Acute tubulo-interstitial nephritis: Secondary | ICD-10-CM | POA: Insufficient documentation

## 2018-12-06 DIAGNOSIS — Z79899 Other long term (current) drug therapy: Secondary | ICD-10-CM | POA: Insufficient documentation

## 2018-12-06 DIAGNOSIS — N12 Tubulo-interstitial nephritis, not specified as acute or chronic: Secondary | ICD-10-CM

## 2018-12-06 DIAGNOSIS — R350 Frequency of micturition: Secondary | ICD-10-CM | POA: Insufficient documentation

## 2018-12-06 DIAGNOSIS — R319 Hematuria, unspecified: Secondary | ICD-10-CM | POA: Insufficient documentation

## 2018-12-06 DIAGNOSIS — F17210 Nicotine dependence, cigarettes, uncomplicated: Secondary | ICD-10-CM | POA: Insufficient documentation

## 2018-12-06 LAB — COMPREHENSIVE METABOLIC PANEL
ALT: 16 U/L (ref 0–44)
AST: 22 U/L (ref 15–41)
Albumin: 4.6 g/dL (ref 3.5–5.0)
Alkaline Phosphatase: 57 U/L (ref 38–126)
Anion gap: 9 (ref 5–15)
BILIRUBIN TOTAL: 0.5 mg/dL (ref 0.3–1.2)
BUN: 11 mg/dL (ref 6–20)
CHLORIDE: 112 mmol/L — AB (ref 98–111)
CO2: 21 mmol/L — ABNORMAL LOW (ref 22–32)
CREATININE: 0.65 mg/dL (ref 0.44–1.00)
Calcium: 9.4 mg/dL (ref 8.9–10.3)
GFR calc Af Amer: >60 mL/min (ref >60)
GLUCOSE: 110 mg/dL — AB (ref 70–99)
Potassium: 4.2 mmol/L (ref 3.5–5.1)
Sodium: 142 mmol/L (ref 135–145)
TOTAL PROTEIN: 8.3 g/dL — AB (ref 6.5–8.1)

## 2018-12-06 LAB — CBC WITH DIFFERENTIAL/PLATELET
ABS IMMATURE GRANULOCYTES: 0.03 10*3/uL (ref 0.00–0.07)
BASOS PCT: 0 %
Basophils Absolute: 0 10*3/uL (ref 0.0–0.1)
EOS ABS: 0.1 10*3/uL (ref 0.0–0.5)
Eosinophils Relative: 1 %
HEMATOCRIT: 43.1 % (ref 36.0–46.0)
Hemoglobin: 14 g/dL (ref 12.0–15.0)
Immature Granulocytes: 0 %
LYMPHS PCT: 32 %
Lymphs Abs: 2.5 10*3/uL (ref 0.7–4.0)
MCH: 28.9 pg (ref 26.0–34.0)
MCHC: 32.5 g/dL (ref 30.0–36.0)
MCV: 88.9 fL (ref 80.0–100.0)
MONO ABS: 0.6 10*3/uL (ref 0.1–1.0)
MONOS PCT: 7 %
Neutro Abs: 4.6 10*3/uL (ref 1.7–7.7)
Neutrophils Relative %: 60 %
PLATELETS: 325 10*3/uL (ref 150–400)
RBC: 4.85 MIL/uL (ref 3.87–5.11)
RDW: 13.2 % (ref 11.5–15.5)
WBC: 7.9 10*3/uL (ref 4.0–10.5)
nRBC: 0 % (ref 0.0–0.2)

## 2018-12-06 LAB — URINALYSIS, ROUTINE W REFLEX MICROSCOPIC
Bilirubin Urine: NEGATIVE
Glucose, UA: NEGATIVE mg/dL
Hgb urine dipstick: NEGATIVE
Ketones, ur: NEGATIVE mg/dL
Nitrite: NEGATIVE
Protein, ur: NEGATIVE mg/dL
Specific Gravity, Urine: 1.01 (ref 1.005–1.030)
WBC, UA: >50 WBC/hpf — ABNORMAL HIGH (ref 0–5)
pH: 7 (ref 5.0–8.0)

## 2018-12-06 LAB — POC URINE PREG, ED: Preg Test, Ur: NEGATIVE

## 2018-12-06 MED ORDER — OXYCODONE HCL 5 MG PO TABS: 5.0000 mg | ORAL_TABLET | ORAL | 0 refills | Status: DC | PRN

## 2018-12-06 MED ORDER — SODIUM CHLORIDE 0.9 % IV SOLN
1.0000 g | Freq: Once | INTRAVENOUS | Status: AC
  Administered 2018-12-06: 1 g via INTRAVENOUS
  Filled 2018-12-06: qty 10

## 2018-12-06 MED ORDER — ONDANSETRON 4 MG PO TBDP: 4.0000 mg | ORAL_TABLET | Freq: Three times a day (TID) | ORAL | 0 refills | Status: DC | PRN

## 2018-12-06 MED ORDER — CEPHALEXIN 500 MG PO CAPS: 500.0000 mg | ORAL_CAPSULE | Freq: Four times a day (QID) | ORAL | 0 refills | Status: AC

## 2018-12-06 MED ORDER — KETOROLAC TROMETHAMINE 30 MG/ML IJ SOLN
15.0000 mg | Freq: Once | INTRAMUSCULAR | Status: AC
  Administered 2018-12-06: 15 mg via INTRAVENOUS
  Filled 2018-12-06: qty 1

## 2018-12-06 MED ORDER — SODIUM CHLORIDE 0.9 % IV BOLUS
1000.0000 mL | Freq: Once | INTRAVENOUS | Status: AC
  Administered 2018-12-06: 1000 mL via INTRAVENOUS

## 2018-12-06 MED ORDER — OXYCODONE HCL 5 MG PO TABS
10.0000 mg | ORAL_TABLET | Freq: Once | ORAL | Status: AC
  Administered 2018-12-06: 10 mg via ORAL
  Filled 2018-12-06: qty 2

## 2018-12-06 NOTE — ED Notes (Signed)
POST VOID BLADDER SCAN RESULTED IN 13mL's

## 2018-12-06 NOTE — ED Notes (Addendum)
CMP still not resulted. This RN called Lab asking for status report on CMP results. Was told "the machine is having issues. Dorethy will be running the lab manually now." EDPA notified.

## 2018-12-06 NOTE — Discharge Instructions (Addendum)
Pyelonephritis  There is evidence of an infection in the kidney called pyelonephritis.  Antibiotics: Please take all of your antibiotics until finished!   You may develop abdominal discomfort or diarrhea from the antibiotic.  You may help offset this with probiotics which you can buy or get in yogurt. Do not eat or take the probiotics until 2 hours after your antibiotic.   Antiinflammatory medications: Take 600 mg of ibuprofen every 6 hours or 440 mg (over the counter dose) to 500 mg (prescription dose) of naproxen every 12 hours for the next 3 days. After this time, these medications may be used as needed for pain. Take these medications with food to avoid upset stomach. Choose only one of these medications, do not take them together.  Oxycodone: May use the oxycodone, as needed, for severe pain.  Do not drive or perform other dangerous activities while taking oxycodone.  Nausea/vomiting: Use the ondansetron (generic for Zofran) for nausea or vomiting.  This medication may not prevent all vomiting or nausea, but can help facilitate better hydration. Things that can help with nausea/vomiting also include peppermint/menthol candies, vitamin B12, and ginger.  Symptoms of any other illness will be intensified and complicated by dehydration. Dehydration can also extend the duration of symptoms. Dehydration typically causes its own symptoms including lightheadedness, nausea, headaches, fatigue increased thirst, and generally feeling unwell. Drink plenty of fluids and get plenty of rest. You should be drinking at least half a liter of water every hour or two to stay hydrated. Electrolyte drinks (ex. Gatorade, Powerade, Pedialyte) are also encouraged. You should be drinking enough fluids to make your urine light yellow, almost clear. If this is not the case, you are not drinking enough water.  Follow-up: Follow-up with urology on this matter.  You also have a cyst on the kidney.  This needs to be worked up  further.  This should be done through the urologist as well.  Return: Return to the ED for worsening symptoms.

## 2018-12-06 NOTE — ED Notes (Addendum)
CMP not resulted yet. PA requested this RN call lab to check on status of CMP results. Was told the lab hadn't been started yet. Lab apologized and stated they would start it now. EDPA noted.

## 2018-12-06 NOTE — ED Triage Notes (Signed)
Patient c/o dysuria, urinary frequency, bilateral low back pain and hematuria x 2 weeks.

## 2018-12-06 NOTE — ED Provider Notes (Signed)
[facility]-EMERGENCY DEPT Provider Note   CSN: [id number] Arrival date & time: 12/06/18  7517    History   Chief Complaint Chief Complaint  Patient presents with  . Dysuria  . Urinary Frequency  . Back Pain  . Hematuria    HPI [name] is a 53 y.o. female.     HPI  [name] is a 53 y.o. female, with a history of hepatitis B and liver failure, presenting to the ED with dysuria for the past 2 weeks.  Accompanied by suprapubic pressure and pain.  4 days ago, began to have left-sided throbbing, 10/10, lower back pain.  This was accompanied by fever up to 101 F.  Pain then moved to the right side.  Endorses urinary frequency and hematuria, but with decreased urine output despite persistent oral hydration.  She has been drinking cranberry juice and taking ibuprofen.  Denies recent antibiotic use.  Denies N/V/D, chest pain, shortness of breath, cough, abnormal vaginal discharge, or any other complaints.    Past Medical History:  Diagnosis Date  . Hepatitis B   . History of ESBL E. coli infection   . History of liver failure    r/t tylenol  . Sickle cell trait [facility])     Patient Active Problem List   Diagnosis Date Noted  . HYPOKALEMIA 05/13/2007  . RUQ PAIN 05/13/2007  . HEPATITIS B 02/01/2007  . PINGUECULA 02/01/2007  . LYMPHEDEMA 02/01/2007  . NECROSIS, ACUTE, LIVER 02/01/2007  . RENAL FAILURE 02/01/2007  . HEPATITIS B, HX OF 02/01/2007    Past Surgical History:  Procedure Laterality Date  . HARDWARE REMOVAL Right 12/22/2013   Procedure: RIGHT HAND HARDWARE REMOVAL, TENOLYSIS/TENOSYNOVECTOMY;  Surgeon: Dominica Severin, MD;  Location: [facility] OR;  Service: Orthopedics;  Laterality: Right;  . INCISION AND DRAINAGE INTRA ORAL ABSCESS    . OPEN REDUCTION INTERNAL FIXATION (ORIF) METACARPAL Right 07/10/2013   Procedure: OPEN REDUCTION INTERNAL FIXATION (ORIF) METACARPAL RIGHT SMALL FINGER ;  Surgeon: Dominica Severin, MD;  Location: [facility] OR;   Service: Orthopedics;  Laterality: Right;  . TUBAL LIGATION    . WISDOM TOOTH EXTRACTION       OB History   No obstetric history on file.      Home Medications    Prior to Admission medications   Medication Sig Start Date End Date Taking? Authorizing Provider  Biotin 1000 MCG tablet Take 1,000 mcg by mouth 3 (three) times daily.     [provider]  cephALEXin (KEFLEX) 500 MG capsule Take 1 capsule (500 mg total) by mouth 4 (four) times daily for 10 days. 12/06/18 12/16/18  Joy, Shawn C, PA-C  fish oil-omega-3 fatty acids 1000 MG capsule Take 1 g by mouth daily.     [provider]  ibuprofen (ADVIL,MOTRIN) 600 MG tablet Take 1 tablet (600 mg total) by mouth every 6 (six) hours as needed. 01/15/17   Elson Areas, PA-C  Milk Thistle 1000 MG CAPS Take 2 capsules by mouth 3 (three) times daily with meals.    [provider]  ondansetron (ZOFRAN ODT) 4 MG disintegrating tablet Take 1 tablet (4 mg total) by mouth every 8 (eight) hours as needed for nausea or vomiting. 12/06/18   Joy, Shawn C, PA-C  oxyCODONE (OXY IR/ROXICODONE) 5 MG immediate release tablet Take 1 tablet (5 mg total) by mouth every 4 (four) hours as needed for severe pain. 12/06/18   Joy, Shawn C, PA-C  phenazopyridine (PYRIDIUM) 200 MG tablet Take 1  tablet (200 mg total) by mouth 3 (three) times daily. Patient not taking: Reported on 01/15/2017 11/13/16   Jacalyn Lefevre, MD  vitamin B-12 (CYANOCOBALAMIN) 500 MCG tablet Take 500 mcg by mouth 3 (three) times daily.     [provider]  vitamin E 1000 UNIT capsule Take 1,000 Units by mouth 3 (three) times daily.    [provider]    Family History Family History  Problem Relation Age of Onset  . Cancer Other   . Diabetes Other   . COPD Mother   . Cancer Mother     Social History Social History   Tobacco Use  . Smoking status: Current Some Day Smoker    Packs/day: 0.15    Years: 30.00    Pack years: 4.50    Types:  Cigarettes  . Smokeless tobacco: Never Used  . Tobacco comment: Still intermittently smoking, was up to half pack a day  Substance Use Topics  . Alcohol use: Yes    Comment: on holiday's  . Drug use: No     Allergies   Acetaminophen; Other; and Peroxide [hydrogen peroxide]   Review of Systems Review of Systems  Constitutional: Positive for fever.  Respiratory: Negative for shortness of breath.   Cardiovascular: Negative for chest pain.  Gastrointestinal: Positive for abdominal pain. Negative for diarrhea, nausea and vomiting.  Genitourinary: Positive for decreased urine volume, difficulty urinating, dysuria, frequency and hematuria.  Musculoskeletal: Positive for back pain.  Neurological: Negative for weakness and numbness.  All other systems reviewed and are negative.    Physical Exam Updated Vital Signs BP (!) 147/97 (BP Location: Right Arm)   Pulse 84   Temp (!) 97.5 F (36.4 C) (Oral)   Resp 17   Ht 5\' 7"  (1.702 m)   Wt 89.8 kg   LMP 11/22/2018   SpO2 100%   BMI 31.01 kg/m   Physical Exam Vitals signs and nursing note reviewed.  Constitutional:      General: She is not in acute distress.    Appearance: She is well-developed. She is not diaphoretic.  HENT:     Head: Normocephalic and atraumatic.     Mouth/Throat:     Mouth: Mucous membranes are moist.     Pharynx: Oropharynx is clear.  Eyes:     Conjunctiva/sclera: Conjunctivae normal.  Neck:     Musculoskeletal: Neck supple.  Cardiovascular:     Rate and Rhythm: Normal rate and regular rhythm.     Pulses: Normal pulses.     Heart sounds: Normal heart sounds.     Comments: Tactile temperature in the extremities appropriate and equal bilaterally. Pulmonary:     Effort: Pulmonary effort is normal. No respiratory distress.     Breath sounds: Normal breath sounds.  Abdominal:     Palpations: Abdomen is soft.     Tenderness: There is abdominal tenderness in the suprapubic area. There is no guarding.    Musculoskeletal:       Back:     Right lower leg: No edema.     Left lower leg: No edema.  Lymphadenopathy:     Cervical: No cervical adenopathy.  Skin:    General: Skin is warm and dry.  Neurological:     Mental Status: She is alert.  Psychiatric:        Mood and Affect: Mood and affect normal.        Speech: Speech normal.        Behavior: Behavior normal.  ED Treatments / Results  Labs (all labs ordered are listed, but only abnormal results are displayed) Labs Reviewed  URINALYSIS, ROUTINE W REFLEX MICROSCOPIC - Abnormal; Notable for the following components:      Result Value   APPearance HAZY (*)    Leukocytes,Ua LARGE (*)    WBC, UA >50 (*)    Bacteria, UA MANY (*)    All other components within normal limits  COMPREHENSIVE METABOLIC PANEL - Abnormal; Notable for the following components:   Chloride 112 (*)    CO2 21 (*)    Glucose, Bld 110 (*)    Total Protein 8.3 (*)    All other components within normal limits  URINE CULTURE  CBC WITH DIFFERENTIAL/PLATELET  POC URINE PREG, ED    EKG None  Radiology Ct Renal Stone Study  Result Date: 12/06/2018 CLINICAL DATA:  Flank pain. Dysuria. Urinary frequency. Hematuria. EXAM: CT ABDOMEN AND PELVIS WITHOUT CONTRAST TECHNIQUE: Multidetector CT imaging of the abdomen and pelvis was performed following the standard protocol without IV contrast. COMPARISON:  03/11/2013 FINDINGS: Lower chest: New small areas of emphysema and slight parenchymal scarring at both lung bases. Heart size is normal. Hepatobiliary: No focal liver abnormality is seen. No gallstones, gallbladder wall thickening, or biliary dilatation. Pancreas: Unremarkable. No pancreatic ductal dilatation or surrounding inflammatory changes. Spleen: Normal in size without focal abnormality. Adrenals/Urinary Tract: Adrenal glands are normal. 2.3 cm low-density lesion in the upper pole of the left kidney, enlarged since the prior CT scan. This is indeterminate but  statistically most likely a cyst. 1 mm stone in the lower pole of the left kidney. Right kidney is normal. No hydronephrosis. No ureteral or bladder calculi. Stomach/Bowel: Stomach is within normal limits. Appendix appears normal. No evidence of bowel wall thickening, distention, or inflammatory changes. Vascular/Lymphatic: Aortic atherosclerosis. No enlarged abdominal or pelvic lymph nodes. Reproductive: Uterus and bilateral adnexa are unremarkable. Other: No abdominal wall hernia or abnormality. No abdominopelvic ascites. Musculoskeletal: No acute abnormalities. Moderately severe bilateral facet arthritis at L4-5 and on the right at L5-S1. IMPRESSION: No acute abnormalities of the abdomen or pelvis. Tiny stone in the lower pole of the left kidney. 2.3 cm low-density lesion in the upper pole of the left kidney, statistically most likely a cyst. Given the patient's hematuria, renal ultrasound recommended for further characterization. Electronically Signed   By: Francene Boyers M.D.   On: 12/06/2018 13:04    Procedures Procedures (including critical care time)  Medications Ordered in ED Medications  sodium chloride 0.9 % bolus 1,000 mL (1,000 mLs Intravenous Bolus 12/06/18 1244)  ketorolac (TORADOL) 30 MG/ML injection 15 mg (15 mg Intravenous Given 12/06/18 1243)  cefTRIAXone (ROCEPHIN) 1 g in sodium chloride 0.9 % 100 mL IVPB (0 g Intravenous Stopped 12/06/18 1341)  oxyCODONE (Oxy IR/ROXICODONE) immediate release tablet 10 mg (10 mg Oral Given 12/06/18 1400)     Initial Impression / Assessment and Plan / ED Course  I have reviewed the triage vital signs and the nursing notes.  Pertinent labs & imaging results that were available during my care of the patient were reviewed by me and considered in my medical decision making (see chart for details).  Clinical Course as of Dec 06 1602  Mon Dec 06, 2018  1315 Spoke with Dr. Jena Gauss, reading radiologist. Clarifies the recommendation for renal ultrasound is  appropriate for outpatient.  There is no need for further emergent imaging.  CT Renal Stone Study [SJ]  2130 Patient states her pain has significantly  improved, now rating it 4/10.   [SJ]  1455 Still waiting on CMP.  Nurse has called multiple times.  Lab now states they are having trouble with their equipment.   [SJ]    Clinical Course User Index [SJ] Joy, Shawn C, PA-C       Patient presents with lower back pain, urinary symptoms, and reported fever. Patient is nontoxic appearing, afebrile, not tachycardic, not tachypneic, not hypotensive, maintains excellent SPO2 on room air.  Patient history, physical exam findings, and UA support pyelonephritis.  No signs of obstructing stone or other acute abnormalities on CT.  We discussed her left renal cyst as well as urology follow-up. Antibiotic therapy initiated. The patient was given instructions for home care as well as return precautions. Patient voices understanding of these instructions, accepts the plan, and is comfortable with discharge.   Vitals:   12/06/18 1300 12/06/18 1358 12/06/18 1430 12/06/18 1530  BP: 140/85 (!) 155/109 (!) 141/87 139/90  Pulse: 77 76 78 81  Resp:  18    Temp:      TempSrc:      SpO2: 100% 100% 98% 99%  Weight:      Height:         Final Clinical Impressions(s) / ED Diagnoses   Final diagnoses:  Pyelonephritis    ED Discharge Orders         Ordered    cephALEXin (KEFLEX) 500 MG capsule  4 times daily     12/06/18 1504    oxyCODONE (OXY IR/ROXICODONE) 5 MG immediate release tablet  Every 4 hours PRN     12/06/18 1504    ondansetron (ZOFRAN ODT) 4 MG disintegrating tablet  Every 8 hours PRN     12/06/18 1504           Anselm Pancoast, PA-C 12/06/18 1607    Geoffery Lyons, MD 12/07/18 [phone]

## 2018-12-07 LAB — URINE CULTURE: Culture: <10000 — AB

## 2019-04-18 ENCOUNTER — Other Ambulatory Visit: Payer: Self-pay

## 2019-04-18 ENCOUNTER — Encounter (HOSPITAL_COMMUNITY): Payer: Self-pay

## 2019-04-18 ENCOUNTER — Emergency Department (HOSPITAL_COMMUNITY): Payer: Self-pay

## 2019-04-18 ENCOUNTER — Emergency Department (HOSPITAL_COMMUNITY)
Admission: EM | Admit: 2019-04-18 | Discharge: 2019-04-18 | Disposition: A | Discharge status: Home / Self Care | Payer: Self-pay | Attending: Emergency Medicine | Admitting: Emergency Medicine

## 2019-04-18 ENCOUNTER — Emergency Department (HOSPITAL_BASED_OUTPATIENT_CLINIC_OR_DEPARTMENT_OTHER): Payer: Self-pay

## 2019-04-18 DIAGNOSIS — L03317 Cellulitis of buttock: Secondary | ICD-10-CM | POA: Insufficient documentation

## 2019-04-18 DIAGNOSIS — F17210 Nicotine dependence, cigarettes, uncomplicated: Secondary | ICD-10-CM | POA: Insufficient documentation

## 2019-04-18 DIAGNOSIS — L03115 Cellulitis of right lower limb: Secondary | ICD-10-CM | POA: Insufficient documentation

## 2019-04-18 DIAGNOSIS — M7989 Other specified soft tissue disorders: Secondary | ICD-10-CM

## 2019-04-18 DIAGNOSIS — Z79899 Other long term (current) drug therapy: Secondary | ICD-10-CM | POA: Insufficient documentation

## 2019-04-18 DIAGNOSIS — L0231 Cutaneous abscess of buttock: Secondary | ICD-10-CM | POA: Insufficient documentation

## 2019-04-18 DIAGNOSIS — R609 Edema, unspecified: Secondary | ICD-10-CM

## 2019-04-18 LAB — CBC WITH DIFFERENTIAL/PLATELET
Abs Immature Granulocytes: 0.04 10*3/uL (ref 0.00–0.07)
Basophils Absolute: 0 10*3/uL (ref 0.0–0.1)
Basophils Relative: 0 %
Eosinophils Absolute: 0.1 10*3/uL (ref 0.0–0.5)
Eosinophils Relative: 1 %
HCT: 39.4 % (ref 36.0–46.0)
Hemoglobin: 13.2 g/dL (ref 12.0–15.0)
Immature Granulocytes: 0 %
Lymphocytes Relative: 24 %
Lymphs Abs: 2.7 10*3/uL (ref 0.7–4.0)
MCH: 29.5 pg (ref 26.0–34.0)
MCHC: 33.5 g/dL (ref 30.0–36.0)
MCV: 88.1 fL (ref 80.0–100.0)
Monocytes Absolute: 1 10*3/uL (ref 0.1–1.0)
Monocytes Relative: 9 %
Neutro Abs: 7.5 10*3/uL (ref 1.7–7.7)
Neutrophils Relative %: 66 %
Platelets: 319 10*3/uL (ref 150–400)
RBC: 4.47 MIL/uL (ref 3.87–5.11)
RDW: 13.1 % (ref 11.5–15.5)
WBC: 11.4 10*3/uL — ABNORMAL HIGH (ref 4.0–10.5)
nRBC: 0 % (ref 0.0–0.2)

## 2019-04-18 LAB — I-STAT BETA HCG BLOOD, ED (MC, WL, AP ONLY): I-stat hCG, quantitative: 9.4 m[IU]/mL — ABNORMAL HIGH (ref <5)

## 2019-04-18 LAB — COMPREHENSIVE METABOLIC PANEL
ALT: 19 U/L (ref 0–44)
AST: 18 U/L (ref 15–41)
Albumin: 4.1 g/dL (ref 3.5–5.0)
Alkaline Phosphatase: 68 U/L (ref 38–126)
Anion gap: 7 (ref 5–15)
BUN: 8 mg/dL (ref 6–20)
CO2: 25 mmol/L (ref 22–32)
Calcium: 9.3 mg/dL (ref 8.9–10.3)
Chloride: 109 mmol/L (ref 98–111)
Creatinine, Ser: 0.54 mg/dL (ref 0.44–1.00)
GFR calc Af Amer: >60 mL/min (ref >60)
GFR calc non Af Amer: >60 mL/min (ref >60)
Glucose, Bld: 101 mg/dL — ABNORMAL HIGH (ref 70–99)
Potassium: 4 mmol/L (ref 3.5–5.1)
Sodium: 141 mmol/L (ref 135–145)
Total Bilirubin: 0.4 mg/dL (ref 0.3–1.2)
Total Protein: 7.8 g/dL (ref 6.5–8.1)

## 2019-04-18 MED ORDER — LIDOCAINE HCL 2 % IJ SOLN
10.0000 mL | Freq: Once | INTRAMUSCULAR | Status: AC
  Administered 2019-04-18: 16:00:00 200 mg
  Filled 2019-04-18: qty 20

## 2019-04-18 MED ORDER — CLINDAMYCIN HCL 150 MG PO CAPS: 450.0000 mg | ORAL_CAPSULE | Freq: Three times a day (TID) | ORAL | 0 refills | Status: AC

## 2019-04-18 MED ORDER — MORPHINE SULFATE (PF) 2 MG/ML IV SOLN
2.0000 mg | Freq: Once | INTRAVENOUS | Status: AC
  Administered 2019-04-18: 2 mg via INTRAVENOUS
  Filled 2019-04-18: qty 1

## 2019-04-18 MED ORDER — SODIUM CHLORIDE 0.9 % IV BOLUS
500.0000 mL | Freq: Once | INTRAVENOUS | Status: AC
  Administered 2019-04-18: 500 mL via INTRAVENOUS

## 2019-04-18 NOTE — ED Notes (Signed)
An After Visit Summary was printed and given to the patient. Discharge instructions given and no further questions at this time, pt leaving with script for antibiotics.

## 2019-04-18 NOTE — ED Provider Notes (Addendum)
[facility]-EMERGENCY DEPT Provider Note   CSN: [id number] Arrival date & time: 04/18/19  [id number]    History   Chief Complaint Chief Complaint  Patient presents with  . Abscess  . Leg Swelling    HPI [name] is a 53 y.o. female today with 2 concerns.  Patient reports 2 abscesses on each side of her superior gluteal cleft that have been present for 4 days.  Right more painful and larger than left.  Describes a moderate intensity throbbing sensation constant worsened with sitting and improved with laying on her side, she is unsure if there is been any drainage.  She denies any radiation of this pain.  She reports that she has had abscesses in this area before amenable to I&D.  She denies any injury or trauma to this area.  She denies any pain with bowel movements.  Patient's second concern today is right foot pain and swelling.  She reports over the past 5 days she has had increasing swelling, erythema and tenderness to her entire right foot however worsened on the medial side, severe in intensity worse with walking and palpation and without alleviating factors.  She denies any injury or trauma to the foot and reports this has never happened before.  She denies any fever/chills, cough, chest pain/shortness of breath, injury/trauma, abdominal pain, nausea/vomiting, diarrhea or any additional concerns today.    HPI  Past Medical History:  Diagnosis Date  . Hepatitis B   . History of ESBL E. coli infection   . History of liver failure    r/t tylenol  . Sickle cell trait [facility](HCC)     Patient Active Problem List   Diagnosis Date Noted  . HYPOKALEMIA 05/13/2007  . RUQ PAIN 05/13/2007  . HEPATITIS B 02/01/2007  . PINGUECULA 02/01/2007  . LYMPHEDEMA 02/01/2007  . NECROSIS, ACUTE, LIVER 02/01/2007  . RENAL FAILURE 02/01/2007  . HEPATITIS B, HX OF 02/01/2007    Past Surgical History:  Procedure Laterality Date  . HARDWARE REMOVAL Right 12/22/2013   Procedure: RIGHT HAND HARDWARE REMOVAL, TENOLYSIS/TENOSYNOVECTOMY;  Surgeon: Dominica SeverinWilliam Gramig, MD;  Location: [facility] OR;  Service: Orthopedics;  Laterality: Right;  . INCISION AND DRAINAGE INTRA ORAL ABSCESS    . OPEN REDUCTION INTERNAL FIXATION (ORIF) METACARPAL Right 07/10/2013   Procedure: OPEN REDUCTION INTERNAL FIXATION (ORIF) METACARPAL RIGHT SMALL FINGER ;  Surgeon: Dominica SeverinWilliam Gramig, MD;  Location: [facility] OR;  Service: Orthopedics;  Laterality: Right;  . TUBAL LIGATION    . WISDOM TOOTH EXTRACTION       OB History   No obstetric history on file.      Home Medications    Prior to Admission medications   Medication Sig Start Date End Date Taking? Authorizing Provider  Biotin 1000 MCG tablet Take 1,000 mcg by mouth 3 (three) times daily.    Yes [provider]  fish oil-omega-3 fatty acids 1000 MG capsule Take 1 g by mouth daily.    Yes [provider]  Milk Thistle 1000 MG CAPS Take 2,000 mg by mouth 3 (three) times daily with meals.    Yes [provider]  vitamin B-12 (CYANOCOBALAMIN) 500 MCG tablet Take 500 mcg by mouth 3 (three) times daily.    Yes [provider]  vitamin E 1000 UNIT capsule Take 1,000 Units by mouth 3 (three) times daily.   Yes [provider]  clindamycin (CLEOCIN) 150 MG capsule Take 3 capsules (450 mg total) by mouth 3 (three) times daily for 7  days. 04/18/19 04/25/19  Nuala Alpha A, PA-C  ibuprofen (ADVIL,MOTRIN) 600 MG tablet Take 1 tablet (600 mg total) by mouth every 6 (six) hours as needed. Patient not taking: Reported on 04/18/2019 01/15/17   Fransico Meadow, PA-C  ondansetron (ZOFRAN ODT) 4 MG disintegrating tablet Take 1 tablet (4 mg total) by mouth every 8 (eight) hours as needed for nausea or vomiting. Patient not taking: Reported on 04/18/2019 12/06/18   Joy, Raquel Sarna C, PA-C  oxyCODONE (OXY IR/ROXICODONE) 5 MG immediate release tablet Take 1 tablet (5 mg total) by mouth every 4 (four) hours as needed for severe pain.  Patient not taking: Reported on 04/18/2019 12/06/18   Lorayne Bender, PA-C  phenazopyridine (PYRIDIUM) 200 MG tablet Take 1 tablet (200 mg total) by mouth 3 (three) times daily. Patient not taking: Reported on 01/15/2017 11/13/16   Isla Pence, MD    Family History Family History  Problem Relation Age of Onset  . Cancer Other   . Diabetes Other   . COPD Mother   . Cancer Mother     Social History Social History   Tobacco Use  . Smoking status: Current Some Day Smoker    Packs/day: 0.15    Years: 30.00    Pack years: 4.50    Types: Cigarettes  . Smokeless tobacco: Never Used  . Tobacco comment: Still intermittently smoking, was up to half pack a day  Substance Use Topics  . Alcohol use: Yes    Comment: on holiday's  . Drug use: No     Allergies   Acetaminophen, Other, and Peroxide [hydrogen peroxide]   Review of Systems Review of Systems Ten systems are reviewed and are negative for acute change except as noted in the HPI  Physical Exam Updated Vital Signs BP 132/77   Pulse 88   Temp 98.8 F (37.1 C) (Oral)   Resp 18   Ht 5\' 7"  (1.702 m)   Wt 93 kg   LMP 04/07/2019   SpO2 99%   BMI 32.11 kg/m   Physical Exam Constitutional:      General: She is not in acute distress.    Appearance: Normal appearance. She is well-developed. She is not ill-appearing or diaphoretic.  HENT:     Head: Normocephalic and atraumatic.     Right Ear: External ear normal.     Left Ear: External ear normal.     Nose: Nose normal.  Eyes:     General: Vision grossly intact. Gaze aligned appropriately.     Pupils: Pupils are equal, round, and reactive to light.  Neck:     Musculoskeletal: Normal range of motion.     Trachea: Trachea and phonation normal. No tracheal deviation.  Cardiovascular:     Pulses:          Dorsalis pedis pulses are 2+ on the right side and 2+ on the left side.  Pulmonary:     Effort: Pulmonary effort is normal. No respiratory distress.  Abdominal:      General: There is no distension.     Palpations: Abdomen is soft.     Tenderness: There is no abdominal tenderness. There is no guarding or rebound.  Genitourinary:    Comments: Initial examination chaperoned by [name] nurse tech.  Approximately 4 cm diameter abscess to the right upper gluteal cleft, 2 cm abscess to the left upper gluteal cleft.  No tracking towards the rectum. Musculoskeletal: Normal range of motion.  Feet:     Right foot:  Protective Sensation: 3 sites tested. 3 sites sensed.     Skin integrity: Erythema and warmth present.     Left foot:     Protective Sensation: 3 sites tested. 3 sites sensed.     Comments: Erythema, swelling and warmth to the dorsum of the right foot. No pain with range of motion of the right ankle, capillary refill, sensation and pedal pulses intact and equal bilaterally. Skin:    General: Skin is warm and dry.       Neurological:     Mental Status: She is alert.     GCS: GCS eye subscore is 4. GCS verbal subscore is 5. GCS motor subscore is 6.     Comments: Speech is clear and goal oriented, follows commands Major Cranial nerves without deficit, no facial droop Moves extremities without ataxia, coordination intact  Psychiatric:        Behavior: Behavior normal.    ED Treatments / Results  Labs (all labs ordered are listed, but only abnormal results are displayed) Labs Reviewed  CBC WITH DIFFERENTIAL/PLATELET - Abnormal; Notable for the following components:      Result Value   WBC 11.4 (*)    All other components within normal limits  COMPREHENSIVE METABOLIC PANEL - Abnormal; Notable for the following components:   Glucose, Bld 101 (*)    All other components within normal limits  I-STAT BETA HCG BLOOD, ED ([facility], [facility], [facility] ONLY) - Abnormal; Notable for the following components:   I-stat hCG, quantitative 9.4 (*)    All other components within normal limits  HCG, SERUM, QUALITATIVE  POC URINE PREG, ED    EKG None  Radiology Dg  Ankle Complete Right  Result Date: 04/18/2019 CLINICAL DATA:  Pain and swelling. EXAM: RIGHT ANKLE - COMPLETE 3+ VIEW COMPARISON:  No recent. FINDINGS: Diffuse soft tissue swelling. No acute bony or joint abnormality identified. No evidence of fracture or dislocation. IMPRESSION: Diffuse soft tissue swelling.  No acute abnormality. Electronically Signed   By: Maisie Fushomas  Register   On: 04/18/2019 12:24   Dg Foot Complete Right  Result Date: 04/18/2019 CLINICAL DATA:  Pain and swelling. EXAM: RIGHT FOOT COMPLETE - 3+ VIEW COMPARISON:  04/18/2019. FINDINGS: No acute bony or joint abnormality identified. No evidence of fracture or dislocation. IMPRESSION: No acute abnormality Electronically Signed   By: Maisie Fushomas  Register   On: 04/18/2019 12:22   Vas [place] Lower Extremity Venous (dvt) (only [facility])  Result Date: 04/18/2019  Lower Venous Study Indications: Swelling, and Edema.  Limitations: Patient movent and positioning. Unable to tolerate compression. Comparison Study: no prior Performing Technologist: Blanch MediaMegan Riddle RVS  Examination Guidelines: A complete evaluation includes B-mode imaging, spectral Doppler, color Doppler, and power Doppler as needed of all accessible portions of each vessel. Bilateral testing is considered an integral part of a complete examination. Limited examinations for reoccurring indications may be performed as noted.  +---------+---------------+---------+-----------+----------+-------+ RIGHT    CompressibilityPhasicitySpontaneityPropertiesSummary +---------+---------------+---------+-----------+----------+-------+ CFV      Full           Yes      Yes                          +---------+---------------+---------+-----------+----------+-------+ SFJ      Full                                                 +---------+---------------+---------+-----------+----------+-------+  FV Prox  Full                                                  +---------+---------------+---------+-----------+----------+-------+ FV Mid   Full                                                 +---------+---------------+---------+-----------+----------+-------+ FV Distal               Yes      Yes                          +---------+---------------+---------+-----------+----------+-------+ PFV      Full                                                 +---------+---------------+---------+-----------+----------+-------+ POP      Full           Yes      Yes                          +---------+---------------+---------+-----------+----------+-------+ PTV      Full                                                 +---------+---------------+---------+-----------+----------+-------+ PERO     Full                                                 +---------+---------------+---------+-----------+----------+-------+     Summary: Right: There is no evidence of deep vein thrombosis in the lower extremity. No cystic structure found in the popliteal fossa.  *See table(s) above for measurements and observations. Electronically signed by Lemar LivingsBrandon Cain MD on 04/18/2019 at 3:53:27 PM.    Final     Procedures Ultrasound ED Soft Tissue  Date/Time: 04/18/2019 3:42 PM Performed by: Bill SalinasMorelli, Shirin Echeverry A, PA-C Authorized by: Bill SalinasMorelli, Arisbeth Purrington A, PA-C   Procedure details:    Indications: localization of abscess and evaluate for cellulitis     Transverse view:  Visualized   Longitudinal view:  Visualized   Images: archived     Limitations:  Patient compliance Location:    Location: buttocks     Side:  Right Findings:     abscess present Comments:     Single abscess right upper buttocks. Left upper buttocks with only mild cellulitis, no identifiable abscess  .Marland Kitchen.Incision and Drainage  Date/Time: 04/18/2019 3:43 PM Performed by: Bill SalinasMorelli, Yousef Huge A, PA-C Authorized by: Bill SalinasMorelli, Tangie Stay A, PA-C   Consent:    Consent obtained:  Verbal   Consent  given by:  Patient   Risks discussed:  Bleeding, incomplete drainage, pain, infection and damage to other organs Location:    Type:  Abscess   Size:  3   Location:  Lower extremity   Lower extremity location:  Buttock   Buttock location:  R buttock Pre-procedure details:    Skin preparation:  Betadine Anesthesia (see MAR for exact dosages):    Anesthesia method:  Local infiltration   Local anesthetic:  Lidocaine 1% w/o epi Procedure type:    Complexity:  Simple Procedure details:    Incision types:  Single straight   Scalpel blade:  11   Wound management:  Probed and deloculated   Drainage:  Purulent   Drainage amount:  Moderate   Wound treatment:  Wound left open   Packing materials:  None Post-procedure details:    Patient tolerance of procedure:  Tolerated well, no immediate complications   (including critical care time)  Medications Ordered in ED Medications  sodium chloride 0.9 % bolus 500 mL (0 mLs Intravenous Stopped 04/18/19 1323)  morphine 2 MG/ML injection 2 mg (2 mg Intravenous Given 04/18/19 1219)  lidocaine (XYLOCAINE) 2 % (with pres) injection 200 mg (200 mg Infiltration Given by Other 04/18/19 1542)     Initial Impression / Assessment and Plan / ED Course  I have reviewed the triage vital signs and the nursing notes.  Pertinent labs & imaging results that were available during my care of the patient were reviewed by me and considered in my medical decision making (see chart for details).    53 year old female with history of buttock abscesses presents with single skin abscess amenable to incision and drainage.  Abscess was not large enough to warrant packing or drain. Patient informed to have wound recheck in 2 days. I have encouraged home warm soaks and flushing.  Mild signs of cellulitis is surrounding skin.  Small area of left buttocks with cellulitis without abscess on ultrasound.  Procedure chaperoned by [name].  Additionally patient with cellulitis of  the dorsum of the right foot.  DVT study: Summary: Right: There is no evidence of deep vein thrombosis in the lower extremity. No cystic structure found in the popliteal fossa  DG Right Foot: IMPRESSION: No acute abnormality  DG Right Ankle: IMPRESSION: Diffuse soft tissue swelling.  No acute abnormality.  CBC with next dose is of 11.4 CMP nonacute Beta-hCG of 9.4, likely false positive Vital signs stable - No pain with motion of the ankle, do not suspect septic arthritis or compartment syndrome.  Appears as cellulitis, patient seen and evaluated by Dr. Jacqulyn Bath, and of care is to treat patient with antibiotics for cellulitis as well as coverage for her abscess and follow-up in 2-3 days.  No further work-up indicated at this time.  Patient with likely false positive pregnancy test she refuses additional pregnancy test today and does not believe she is pregnant.  Nevertheless will avoid teratogenic antibiotics and prescribed clindamycin 450 mg 3 times daily x7 days and encourage follow-up in 2-3 days.  Patient advised to take antibiotics if she believes she is pregnant because there is always risk of adverse effect she states understanding and still chooses to take antibiotic.  At this time there does not appear to be any evidence of an acute emergency medical condition and the patient appears stable for discharge with appropriate outpatient follow up. Diagnosis was discussed with patient who verbalizes understanding of care plan and is agreeable to discharge. I have discussed return precautions with patient who verbalizes understanding of return precautions. Patient encouraged to follow-up with their PCP. All questions answered.  Patient seen and evaluated by Dr. Jacqulyn Bath during this visit who agrees with plan of care  and discharge.  Note: Portions of this report may have been transcribed using voice recognition software. Every effort was made to ensure accuracy; however, inadvertent computerized  transcription errors may still be present. Final Clinical Impressions(s) / ED Diagnoses   Final diagnoses:  Cellulitis of right lower extremity  Abscess of right buttock  Cellulitis of left buttock    ED Discharge Orders         Ordered    clindamycin (CLEOCIN) 150 MG capsule  3 times daily     04/18/19 1555           Elizabeth Palau 04/18/19 1556    [address] 04/18/19 1605    Maia Plan, MD 04/19/19 1023

## 2019-04-18 NOTE — Progress Notes (Signed)
Lower extremity venous has been completed.   Preliminary results in CV Proc.   Abram Sander 04/18/2019 1:26 PM

## 2019-04-18 NOTE — ED Triage Notes (Signed)
Pt states she has boils on her buttocks x 4 days. Pt states that it is very painful and large. Pt states her right foot has been swollen x 5 days. Redness noted.

## 2019-04-18 NOTE — Discharge Instructions (Addendum)
You have been diagnosed today with cellulitis of the right foot, cellulitis of the left buttock, abscess of the right buttock.  At this time there does not appear to be the presence of an emergent medical condition, however there is always the potential for conditions to change. Please read and follow the below instructions.  Please return to the Emergency Department immediately for any new or worsening symptoms or if your symptoms do not improve within 2 days. Please be sure to follow up with your Primary Care Provider within one week regarding your visit today; please call their office to schedule an appointment even if you are feeling better for a follow-up visit. Please keep on practicing warm soaks of your abscess on your buttocks for the next several days to facilitate drainage.  Use warm clean water soaks rags or a clean bath.  Please go to an urgent care, your primary care doctor's office or return here to the emergency department and 2 days, 48 hours, for recheck of your abscess as well as for recheck of your cellulitis of your left buttock and your right foot.  Please take the antibiotic clindamycin 450 mg 3 times daily as prescribed for treatment of your infections.  If you believe that you may be pregnant do not take the antibiotic as this may have adverse effects on a pregnancy.  You may use over-the-counter Tylenol and ibuprofen as directed on the packaging to help with your symptoms.  Get help right away if: You have very bad (severe) pain. You see red streaks on your skin spreading away from the abscess. Your symptoms get worse. You feel very sleepy. You throw up (vomit) or have watery poop (diarrhea) for a long time. You see red streaks coming from the area. Your red area gets larger. Your red area turns dark in color. You have fever/chills Any new/concerning or worsening symptoms  Please read the additional information packets attached to your discharge summary.  Do not take  your medicine if  develop an itchy rash, swelling in your mouth or lips, or difficulty breathing; call 911 and seek immediate emergency medical attention if this occurs.

## 2019-06-21 ENCOUNTER — Other Ambulatory Visit: Payer: Self-pay

## 2019-06-21 ENCOUNTER — Emergency Department (HOSPITAL_COMMUNITY)
Admission: EM | Admit: 2019-06-21 | Discharge: 2019-06-21 | Disposition: A | Discharge status: Home / Self Care | Payer: Medicaid Other | Attending: Emergency Medicine | Admitting: Emergency Medicine

## 2019-06-21 ENCOUNTER — Emergency Department (HOSPITAL_COMMUNITY): Payer: Medicaid Other

## 2019-06-21 ENCOUNTER — Encounter (HOSPITAL_COMMUNITY): Payer: Self-pay

## 2019-06-21 DIAGNOSIS — Y929 Unspecified place or not applicable: Secondary | ICD-10-CM | POA: Insufficient documentation

## 2019-06-21 DIAGNOSIS — Y999 Unspecified external cause status: Secondary | ICD-10-CM | POA: Insufficient documentation

## 2019-06-21 DIAGNOSIS — F17210 Nicotine dependence, cigarettes, uncomplicated: Secondary | ICD-10-CM | POA: Insufficient documentation

## 2019-06-21 DIAGNOSIS — Y9389 Activity, other specified: Secondary | ICD-10-CM | POA: Insufficient documentation

## 2019-06-21 DIAGNOSIS — S60222A Contusion of left hand, initial encounter: Secondary | ICD-10-CM

## 2019-06-21 DIAGNOSIS — Z79899 Other long term (current) drug therapy: Secondary | ICD-10-CM | POA: Insufficient documentation

## 2019-06-21 DIAGNOSIS — W07XXXA Fall from chair, initial encounter: Secondary | ICD-10-CM | POA: Insufficient documentation

## 2019-06-21 MED ORDER — OXYCODONE HCL 5 MG PO TABS
5.0000 mg | ORAL_TABLET | Freq: Once | ORAL | Status: AC
  Administered 2019-06-21: 15:00:00 5 mg via ORAL
  Filled 2019-06-21: qty 1

## 2019-06-21 NOTE — ED Provider Notes (Signed)
[facility] DEPT Provider Note   CSN: [id number] Arrival date & time: 06/21/19  1219     History   Chief Complaint Chief Complaint  Patient presents with  . Hand Pain    HPI [name] is a 53 y.o. female.  With past medical history of hepatitis B, presenting to the emergency department with complaint of sudden onset of left hand pain that began after mechanical fall this morning.  Patient states she was trying to hang posters at work when she slipped off the chair and fell landing on her left hand.  She has pain that is worse to the ulnar aspect of the left hand and fourth and fifth digits.  She is associated bruising and swelling.  She took ibuprofen without significant relief.  She denies wrist pain, elbow or shoulder pain on that left arm.  No other injuries reported.     The history is provided by the patient.    Past Medical History:  Diagnosis Date  . Hepatitis B   . History of ESBL E. coli infection   . History of liver failure    r/t tylenol  . Sickle cell trait [facility])     Patient Active Problem List   Diagnosis Date Noted  . HYPOKALEMIA 05/13/2007  . RUQ PAIN 05/13/2007  . HEPATITIS B 02/01/2007  . PINGUECULA 02/01/2007  . LYMPHEDEMA 02/01/2007  . NECROSIS, ACUTE, LIVER 02/01/2007  . RENAL FAILURE 02/01/2007  . HEPATITIS B, HX OF 02/01/2007    Past Surgical History:  Procedure Laterality Date  . HARDWARE REMOVAL Right 12/22/2013   Procedure: RIGHT HAND HARDWARE REMOVAL, TENOLYSIS/TENOSYNOVECTOMY;  Surgeon: Roseanne Kaufman, MD;  Location: [place];  Service: Orthopedics;  Laterality: Right;  . INCISION AND DRAINAGE INTRA ORAL ABSCESS    . OPEN REDUCTION INTERNAL FIXATION (ORIF) METACARPAL Right 07/10/2013   Procedure: OPEN REDUCTION INTERNAL FIXATION (ORIF) METACARPAL RIGHT SMALL FINGER ;  Surgeon: Roseanne Kaufman, MD;  Location: [place];  Service: Orthopedics;  Laterality: Right;  . TUBAL LIGATION    . WISDOM TOOTH EXTRACTION        OB History   No obstetric history on file.      Home Medications    Prior to Admission medications   Medication Sig Start Date End Date Taking? Authorizing Provider  Biotin 1000 MCG tablet Take 1,000 mcg by mouth 3 (three) times daily.     [provider]  fish oil-omega-3 fatty acids 1000 MG capsule Take 1 g by mouth daily.     [provider]  ibuprofen (ADVIL,MOTRIN) 600 MG tablet Take 1 tablet (600 mg total) by mouth every 6 (six) hours as needed. Patient not taking: Reported on 04/18/2019 01/15/17   Fransico Meadow, PA-C  Milk Thistle 1000 MG CAPS Take 2,000 mg by mouth 3 (three) times daily with meals.     [provider]  ondansetron (ZOFRAN ODT) 4 MG disintegrating tablet Take 1 tablet (4 mg total) by mouth every 8 (eight) hours as needed for nausea or vomiting. Patient not taking: Reported on 04/18/2019 12/06/18   Joy, Raquel Sarna C, PA-C  oxyCODONE (OXY IR/ROXICODONE) 5 MG immediate release tablet Take 1 tablet (5 mg total) by mouth every 4 (four) hours as needed for severe pain. Patient not taking: Reported on 04/18/2019 12/06/18   Lorayne Bender, PA-C  phenazopyridine (PYRIDIUM) 200 MG tablet Take 1 tablet (200 mg total) by mouth 3 (three) times daily. Patient not taking: Reported on 01/15/2017 11/13/16  Jacalyn Lefevre, MD  vitamin B-12 (CYANOCOBALAMIN) 500 MCG tablet Take 500 mcg by mouth 3 (three) times daily.     [provider]  vitamin E 1000 UNIT capsule Take 1,000 Units by mouth 3 (three) times daily.    [provider]    Family History Family History  Problem Relation Age of Onset  . Cancer Other   . Diabetes Other   . COPD Mother   . Cancer Mother     Social History Social History   Tobacco Use  . Smoking status: Current Some Day Smoker    Packs/day: 0.15    Years: 30.00    Pack years: 4.50    Types: Cigarettes  . Smokeless tobacco: Never Used  . Tobacco comment: Still intermittently smoking, was up to half pack a  day  Substance Use Topics  . Alcohol use: Yes    Comment: on holiday's  . Drug use: No     Allergies   Acetaminophen, Other, and Peroxide [hydrogen peroxide]   Review of Systems Review of Systems  Musculoskeletal: Positive for arthralgias.  Skin: Positive for color change. Negative for wound.  Neurological: Negative for numbness.     Physical Exam Updated Vital Signs BP (!) 136/95   Pulse 98   Temp 98.9 F (37.2 C) (Oral)   Resp 16   Wt 92.1 kg   SpO2 98%   BMI 31.79 kg/m   Physical Exam Vitals signs and nursing note reviewed.  Constitutional:      Appearance: She is well-developed.  HENT:     Head: Normocephalic and atraumatic.  Eyes:     Conjunctiva/sclera: Conjunctivae normal.  Cardiovascular:     Rate and Rhythm: Normal rate.     Comments: Radial pulses intact. Pulmonary:     Effort: Pulmonary effort is normal.  Musculoskeletal:     Comments: Left hand with bruising over the fourth and fifth MCP joints with associated swelling.  There is generalized tenderness to the left fourth and fifth metacarpals and fourth and fifth digits.  The wrist has no tenderness and normal range of motion without pain.  No anatomical snuffbox tenderness.  No wounds.  Normal distal sensation.  Elbow and shoulder are benign.  Neurological:     Mental Status: She is alert.  Psychiatric:        Mood and Affect: Mood normal.        Behavior: Behavior normal.      ED Treatments / Results  Labs (all labs ordered are listed, but only abnormal results are displayed) Labs Reviewed - No data to display  EKG None  Radiology Dg Hand Complete Left  Result Date: 06/21/2019 CLINICAL DATA:  53 year old female with a history of fall EXAM: LEFT HAND - COMPLETE 3+ VIEW COMPARISON:  None. FINDINGS: There is no evidence of fracture or dislocation. There is no evidence of arthropathy or other focal bone abnormality. Soft tissues are unremarkable. IMPRESSION: Negative for acute bony  abnormality Electronically Signed   By: Gilmer Mor D.O.   On: 06/21/2019 14:37    Procedures Procedures (including critical care time)  Medications Ordered in ED Medications  oxyCODONE (Oxy IR/ROXICODONE) immediate release tablet 5 mg (5 mg Oral Given 06/21/19 1452)     Initial Impression / Assessment and Plan / ED Course  I have reviewed the triage vital signs and the nursing notes.  Pertinent labs & imaging results that were available during my care of the patient were reviewed by me and considered in  my medical decision making (see chart for details).       Patient with left hand pain after mechanical fall this morning.  No other injuries reported.  There is bruising and swelling noted to the ulnar aspect of the hand, no wounds or deformity.  Neurovascularly intact. Pain treated.  X-ray is negative per radiologist, and reviewed by myself. Likely contusion. Will treat symptomatically, RICE therapy, OTC medications, PCP follow up. Safe for discharge.  Discussed results, findings, treatment and follow up. Patient advised of return precautions. Patient verbalized understanding and agreed with plan.  Final Clinical Impressions(s) / ED Diagnoses   Final diagnoses:  Contusion of left hand, initial encounter    ED Discharge Orders    None       Dollene Mallery, [place] N, PA-C 06/21/19 1528    Alvira Monday, MD 06/23/19 1118

## 2019-06-21 NOTE — ED Triage Notes (Signed)
Pt c/o left hand pain. Pt was hanging a poster at work when the chair gave out under her. Swelling noted at base of pinky/ring finger.

## 2019-06-21 NOTE — Discharge Instructions (Addendum)
Please read instructions below. Apply ice to your hand for 20 minutes at a time. You can take ibuprofen every 6 hours as needed for pain. Schedule an appointment with your primary care if symptoms persist. Return to the ER for new or concerning symptoms.

## 2019-08-22 ENCOUNTER — Emergency Department (HOSPITAL_COMMUNITY)
Admission: EM | Admit: 2019-08-22 | Discharge: 2019-08-22 | Disposition: A | Discharge status: Home / Self Care | Payer: Medicaid Other | Attending: Emergency Medicine | Admitting: Emergency Medicine

## 2019-08-22 ENCOUNTER — Other Ambulatory Visit: Payer: Self-pay

## 2019-08-22 ENCOUNTER — Encounter (HOSPITAL_COMMUNITY): Payer: Self-pay

## 2019-08-22 DIAGNOSIS — Z5321 Procedure and treatment not carried out due to patient leaving prior to being seen by health care provider: Secondary | ICD-10-CM | POA: Insufficient documentation

## 2019-08-22 LAB — I-STAT BETA HCG BLOOD, ED (MC, WL, AP ONLY): I-stat hCG, quantitative: <5 m[IU]/mL (ref <5)

## 2019-08-22 NOTE — ED Notes (Addendum)
Pt called from lobby x2 to update v/s with no answer.

## 2019-08-22 NOTE — ED Triage Notes (Addendum)
Pt presents with c/o hematuria. Pt reports pain with urination for a couple of weeks. Pt reports body aches. Uncertain whether she has a bladder infection or UTI. Pt reports the hematuria started yesterday. Pt reports her body is so sore all over which is how she has felt before when she had something similar.

## 2019-08-22 NOTE — ED Notes (Signed)
Called again, no answer

## 2019-08-23 ENCOUNTER — Emergency Department (HOSPITAL_COMMUNITY)
Admission: EM | Admit: 2019-08-23 | Discharge: 2019-08-23 | Disposition: A | Discharge status: Home / Self Care | Payer: Self-pay | Attending: Emergency Medicine | Admitting: Emergency Medicine

## 2019-08-23 ENCOUNTER — Other Ambulatory Visit: Payer: Self-pay

## 2019-08-23 ENCOUNTER — Encounter (HOSPITAL_COMMUNITY): Payer: Self-pay

## 2019-08-23 ENCOUNTER — Emergency Department (HOSPITAL_COMMUNITY): Payer: Self-pay

## 2019-08-23 DIAGNOSIS — N390 Urinary tract infection, site not specified: Secondary | ICD-10-CM | POA: Insufficient documentation

## 2019-08-23 DIAGNOSIS — N3001 Acute cystitis with hematuria: Secondary | ICD-10-CM

## 2019-08-23 DIAGNOSIS — F17210 Nicotine dependence, cigarettes, uncomplicated: Secondary | ICD-10-CM | POA: Insufficient documentation

## 2019-08-23 DIAGNOSIS — R079 Chest pain, unspecified: Secondary | ICD-10-CM

## 2019-08-23 DIAGNOSIS — R0789 Other chest pain: Secondary | ICD-10-CM | POA: Insufficient documentation

## 2019-08-23 LAB — BASIC METABOLIC PANEL
Anion gap: 13 (ref 5–15)
BUN: 7 mg/dL (ref 6–20)
CO2: 22 mmol/L (ref 22–32)
Calcium: 8.6 mg/dL — ABNORMAL LOW (ref 8.9–10.3)
Chloride: 101 mmol/L (ref 98–111)
Creatinine, Ser: 1 mg/dL (ref 0.44–1.00)
GFR calc Af Amer: >60 mL/min (ref >60)
GFR calc non Af Amer: >60 mL/min (ref >60)
Glucose, Bld: 148 mg/dL — ABNORMAL HIGH (ref 70–99)
Potassium: 3.2 mmol/L — ABNORMAL LOW (ref 3.5–5.1)
Sodium: 136 mmol/L (ref 135–145)

## 2019-08-23 LAB — CBC
HCT: 34.6 % — ABNORMAL LOW (ref 36.0–46.0)
Hemoglobin: 11.4 g/dL — ABNORMAL LOW (ref 12.0–15.0)
MCH: 28.6 pg (ref 26.0–34.0)
MCHC: 32.9 g/dL (ref 30.0–36.0)
MCV: 86.9 fL (ref 80.0–100.0)
Platelets: 251 10*3/uL (ref 150–400)
RBC: 3.98 MIL/uL (ref 3.87–5.11)
RDW: 14.4 % (ref 11.5–15.5)
WBC: 15.1 10*3/uL — ABNORMAL HIGH (ref 4.0–10.5)
nRBC: 0 % (ref 0.0–0.2)

## 2019-08-23 LAB — URINALYSIS, ROUTINE W REFLEX MICROSCOPIC
Bacteria, UA: NONE SEEN
Bilirubin Urine: NEGATIVE
Glucose, UA: NEGATIVE mg/dL
Ketones, ur: NEGATIVE mg/dL
Nitrite: NEGATIVE
Protein, ur: NEGATIVE mg/dL
Specific Gravity, Urine: 1.006 (ref 1.005–1.030)
WBC, UA: >50 WBC/hpf — ABNORMAL HIGH (ref 0–5)
pH: 6 (ref 5.0–8.0)

## 2019-08-23 LAB — I-STAT BETA HCG BLOOD, ED (MC, WL, AP ONLY): I-stat hCG, quantitative: 47.9 m[IU]/mL — ABNORMAL HIGH (ref <5)

## 2019-08-23 LAB — TROPONIN I (HIGH SENSITIVITY): Troponin I (High Sensitivity): 4 ng/L (ref <18)

## 2019-08-23 LAB — HCG, QUANTITATIVE, PREGNANCY: hCG, Beta Chain, Quant, S: 2 m[IU]/mL (ref <5)

## 2019-08-23 MED ORDER — MORPHINE SULFATE (PF) 4 MG/ML IV SOLN
4.0000 mg | Freq: Once | INTRAVENOUS | Status: AC
  Administered 2019-08-23: 10:00:00 4 mg via INTRAVENOUS
  Filled 2019-08-23: qty 1

## 2019-08-23 MED ORDER — HYDROCODONE-ACETAMINOPHEN 5-325 MG PO TABS: 1.0000 | ORAL_TABLET | Freq: Four times a day (QID) | ORAL | 0 refills | Status: DC | PRN

## 2019-08-23 MED ORDER — CIPROFLOXACIN HCL 500 MG PO TABS: 500.0000 mg | ORAL_TABLET | Freq: Two times a day (BID) | ORAL | 0 refills | Status: DC

## 2019-08-23 MED ORDER — LORAZEPAM 2 MG/ML IJ SOLN
1.0000 mg | Freq: Once | INTRAMUSCULAR | Status: AC
  Administered 2019-08-23: 1 mg via INTRAVENOUS
  Filled 2019-08-23: qty 1

## 2019-08-23 MED ORDER — SODIUM CHLORIDE 0.9% FLUSH
3.0000 mL | Freq: Once | INTRAVENOUS | Status: AC
  Administered 2019-08-23: 10:00:00 3 mL via INTRAVENOUS

## 2019-08-23 MED ORDER — SODIUM CHLORIDE 0.9 % IV BOLUS
1000.0000 mL | Freq: Once | INTRAVENOUS | Status: AC
  Administered 2019-08-23: 1000 mL via INTRAVENOUS

## 2019-08-23 NOTE — ED Triage Notes (Signed)
Patient c/o hematuria yesterday, but has c/o dysuria x 10 days.  Patient was here at the ED,but left due to a family emergency.  Patient c/o constant mid chest pain x 3 days. paienat denies any SOB, N/V.

## 2019-08-23 NOTE — ED Notes (Signed)
Pt ambulated to bathroom without assistance. Upon return to her room, pt held up her hand with the IV in it and said that there was blood all over the bathroom. IV noted to be out of pt's hand. Removed IV from hand and alerted provider. Advised pt that we need a urine sample. Pt seen walking around room and to the bathroom with a lighter and cigarette in her hand. Advised pt that she ABSOLUTELY cannot smoke inside of this hospital and reviewed the serious safety risks of doing that as we have oxygen in our rooms. Pt reports that she would not do that. Pt is very mobile around the room,  and has taken several pieces of laundry (washcloths) out of the cabinets and placed them in a personal belongings bag. Advised pt that she cannot take our laundry home. MD made aware of all of this.

## 2019-08-23 NOTE — ED Provider Notes (Signed)
[facility]-EMERGENCY DEPT Provider Note   CSN: [id number] Arrival date & time: 08/23/19  [id number]     History   Chief Complaint Chief Complaint  Patient presents with  . Chest Pain  . Dysuria  . Hematuria    HPI [name] is a 53 y.o. female.     Patient is a 53 year old female with history of Hepatitis B presenting with complaints of urinary frequency and burning for the past 10 days.  She describes pain now in her chest and suprapubic region.  No fevers of chills.  No dyspnea.  Patient also feels very stressed out.  She tells me that her mother is dying from COPD and she appears quite anxious about this.    The history is provided by the patient.  Chest Pain Pain location:  Substernal area Pain quality: tightness   Pain radiates to:  Does not radiate Pain severity:  Moderate Onset quality:  Sudden Timing:  Constant Progression:  Worsening Relieved by:  Nothing Worsened by:  Nothing Ineffective treatments:  None tried Dysuria Hematuria Associated symptoms include chest pain.    Past Medical History:  Diagnosis Date  . Hepatitis B   . History of ESBL E. coli infection   . History of liver failure    r/t tylenol  . Sickle cell trait [facility](H[facility])     Patient Active Problem List   Diagnosis Date Noted  . HYPOKALEMIA 05/13/2007  . RUQ PAIN 05/13/2007  . HEPATITIS B 02/01/2007  . PINGUECULA 02/01/2007  . LYMPHEDEMA 02/01/2007  . NECROSIS, ACUTE, LIVER 02/01/2007  . RENAL FAILURE 02/01/2007  . HEPATITIS B, HX OF 02/01/2007    Past Surgical History:  Procedure Laterality Date  . HARDWARE REMOVAL Right 12/22/2013   Procedure: RIGHT HAND HARDWARE REMOVAL, TENOLYSIS/TENOSYNOVECTOMY;  Surgeon: Dominica SeverinWilliam Gramig, MD;  Location: [facility] OR;  Service: Orthopedics;  Laterality: Right;  . INCISION AND DRAINAGE INTRA ORAL ABSCESS    . OPEN REDUCTION INTERNAL FIXATION (ORIF) METACARPAL Right 07/10/2013   Procedure: OPEN REDUCTION INTERNAL FIXATION (ORIF)  METACARPAL RIGHT SMALL FINGER ;  Surgeon: Dominica SeverinWilliam Gramig, MD;  Location: [facility] OR;  Service: Orthopedics;  Laterality: Right;  . TUBAL LIGATION    . WISDOM TOOTH EXTRACTION       OB History   No obstetric history on file.      Home Medications    Prior to Admission medications   Medication Sig Start Date End Date Taking? Authorizing Provider  Biotin 1000 MCG tablet Take 1,000 mcg by mouth 3 (three) times daily.     [provider]  fish oil-omega-3 fatty acids 1000 MG capsule Take 1 g by mouth daily.     [provider]  ibuprofen (ADVIL,MOTRIN) 600 MG tablet Take 1 tablet (600 mg total) by mouth every 6 (six) hours as needed. Patient not taking: Reported on 04/18/2019 01/15/17   Elson AreasSofia, Leslie K, PA-C  Milk Thistle 1000 MG CAPS Take 2,000 mg by mouth 3 (three) times daily with meals.     [provider]  ondansetron (ZOFRAN ODT) 4 MG disintegrating tablet Take 1 tablet (4 mg total) by mouth every 8 (eight) hours as needed for nausea or vomiting. Patient not taking: Reported on 04/18/2019 12/06/18   Joy, Ines BloomerShawn C, PA-C  oxyCODONE (OXY IR/ROXICODONE) 5 MG immediate release tablet Take 1 tablet (5 mg total) by mouth every 4 (four) hours as needed for severe pain. Patient not taking: Reported on 04/18/2019 12/06/18   Anselm PancoastJoy, Shawn C, PA-C  phenazopyridine (PYRIDIUM) 200 MG tablet Take 1 tablet (200 mg total) by mouth 3 (three) times daily. Patient not taking: Reported on 01/15/2017 11/13/16   Isla Pence, MD  vitamin B-12 (CYANOCOBALAMIN) 500 MCG tablet Take 500 mcg by mouth 3 (three) times daily.     [provider]  vitamin E 1000 UNIT capsule Take 1,000 Units by mouth 3 (three) times daily.    [provider]    Family History Family History  Problem Relation Age of Onset  . Cancer Other   . Diabetes Other   . COPD Mother   . Cancer Mother     Social History Social History   Tobacco Use  . Smoking status: Current Some Day Smoker    Packs/day:  0.15    Years: 30.00    Pack years: 4.50    Types: Cigarettes  . Smokeless tobacco: Never Used  . Tobacco comment: Still intermittently smoking, was up to half pack a day  Substance Use Topics  . Alcohol use: Yes    Comment: on holiday's  . Drug use: No     Allergies   Acetaminophen, Other, and Peroxide [hydrogen peroxide]   Review of Systems Review of Systems  Cardiovascular: Positive for chest pain.  Genitourinary: Positive for dysuria and hematuria.  All other systems reviewed and are negative.    Physical Exam Updated Vital Signs BP (!) 149/102   Pulse (!) 104   Temp 97.7 F (36.5 C) (Oral)   Resp 15   Ht 5\' 7"  (1.702 m)   Wt 92.5 kg   LMP 08/04/2019 (Approximate)   SpO2 100%   BMI 31.95 kg/m   Physical Exam Vitals signs and nursing note reviewed.  Constitutional:      General: She is not in acute distress.    Appearance: She is well-developed. She is not diaphoretic.  HENT:     Head: Normocephalic and atraumatic.  Neck:     Musculoskeletal: Normal range of motion and neck supple.  Cardiovascular:     Rate and Rhythm: Normal rate and regular rhythm.     Heart sounds: No murmur. No friction rub. No gallop.   Pulmonary:     Effort: Pulmonary effort is normal. No respiratory distress.     Breath sounds: Normal breath sounds. No wheezing.  Abdominal:     General: Bowel sounds are normal. There is no distension.     Palpations: Abdomen is soft.     Tenderness: There is abdominal tenderness.     Comments: There is ttp in the suprapubic region.  Musculoskeletal: Normal range of motion.  Skin:    General: Skin is warm and dry.  Neurological:     Mental Status: She is alert and oriented to person, place, and time.      ED Treatments / Results  Labs (all labs ordered are listed, but only abnormal results are displayed) Labs Reviewed  BASIC METABOLIC PANEL  CBC  URINALYSIS, ROUTINE W REFLEX MICROSCOPIC  I-STAT BETA HCG BLOOD, ED ([place], [facility], [facility] ONLY)   TROPONIN I (HIGH SENSITIVITY)    EKG EKG Interpretation  Date/Time:  Tuesday August 23 2019 09:34:44 EST Ventricular Rate:  102 PR Interval:    QRS Duration: 71 QT Interval:  338 QTC Calculation: 441 R Axis:   47 Text Interpretation: Sinus tachycardia Prominent P waves, nondiagnostic Confirmed by Veryl Speak [phone]) on 08/23/2019 9:39:13 AM   Radiology No results found.  Procedures Procedures (including critical care time)  Medications Ordered in ED  Medications  sodium chloride flush (NS) 0.9 % injection 3 mL (has no administration in time range)  morphine 4 MG/ML injection 4 mg (has no administration in time range)  LORazepam (ATIVAN) injection 1 mg (has no administration in time range)     Initial Impression / Assessment and Plan / ED Course  I have reviewed the triage vital signs and the nursing notes.  Pertinent labs & imaging results that were available during my care of the patient were reviewed by me and considered in my medical decision making (see chart for details).  Patient presenting here with suprapubic pain, chest pain, and dysuria.  She is quite dramatic in her presentation, but nothing in her workup appears life-threatening or emergent.  Cardiac work-up is negative.  She does have a urinary tract infection and a mild leukocytosis.  It is possible she has an early pyelonephritis.  She will be treated with antibiotics and is to follow-up as needed if not improving.  Final Clinical Impressions(s) / ED Diagnoses   Final diagnoses:  None    ED Discharge Orders    None       Geoffery Lyons, MD 08/23/19 1318

## 2019-08-23 NOTE — Discharge Instructions (Addendum)
Begin taking Cipro as prescribed.  Hydrocodone as prescribed as needed for pain.  Drink plenty of fluids and get plenty of rest.  Follow-up with your primary doctor if symptoms or not improving in the next few days.

## 2021-07-21 ENCOUNTER — Emergency Department (HOSPITAL_COMMUNITY)
Admission: EM | Admit: 2021-07-21 | Discharge: 2021-07-21 | Disposition: A | Discharge status: Home / Self Care | Payer: Medicaid Other | Attending: Emergency Medicine | Admitting: Emergency Medicine

## 2021-07-21 ENCOUNTER — Other Ambulatory Visit: Payer: Self-pay

## 2021-07-21 ENCOUNTER — Encounter (HOSPITAL_COMMUNITY): Payer: Self-pay

## 2021-07-21 DIAGNOSIS — L989 Disorder of the skin and subcutaneous tissue, unspecified: Secondary | ICD-10-CM

## 2021-07-21 DIAGNOSIS — F17210 Nicotine dependence, cigarettes, uncomplicated: Secondary | ICD-10-CM | POA: Insufficient documentation

## 2021-07-21 LAB — RAPID HIV SCREEN (HIV 1/2 AB+AG)
HIV 1/2 Antibodies: NONREACTIVE
HIV-1 P24 Antigen - HIV24: NONREACTIVE

## 2021-07-21 NOTE — Discharge Instructions (Signed)
You were seen and evaluated in the emergency department for further evaluation of skin lesions.  As we discussed, I am not entirely sure what these lesions are.  But we have ordered the appropriate testing.  Please keep up-to-date with your MyChart on results.  If the results are positive you will be contacted to come back in for appropriate therapy.  Please return to the emergency department if you experience more these lesions, vaginal symptoms, urinary symptoms, or any other concerns you might have.

## 2021-07-21 NOTE — ED Provider Notes (Signed)
[facility]-EMERGENCY DEPT Provider Note   CSN: [id number] Arrival date & time: 07/21/21  1941     History Chief Complaint  Patient presents with   Rash    [name] is a 55 y.o. female who presents to the emergency department with concerns of possible syphilis infection.  She does have a history of this when she was in her 59s.  This was successfully treated at that time.  She states her symptoms initially started with a lesion on her vaginal area which was painful.  That is since resolved.  Now she is having intermittent lesions in her trunk and extremities that come and go.  These lesions are also painful.  She reports associated subjective fever but denies any vaginal symptoms at this time and urinary symptoms.   Rash     Past Medical History:  Diagnosis Date   Hepatitis B    History of ESBL E. coli infection    History of liver failure    r/t tylenol   Sickle cell trait [facility])     Patient Active Problem List   Diagnosis Date Noted   HYPOKALEMIA 05/13/2007   RUQ PAIN 05/13/2007   HEPATITIS B 02/01/2007   PINGUECULA 02/01/2007   LYMPHEDEMA 02/01/2007   NECROSIS, ACUTE, LIVER 02/01/2007   RENAL FAILURE 02/01/2007   HEPATITIS B, HX OF 02/01/2007    Past Surgical History:  Procedure Laterality Date   HARDWARE REMOVAL Right 12/22/2013   Procedure: RIGHT HAND HARDWARE REMOVAL, TENOLYSIS/TENOSYNOVECTOMY;  Surgeon: Dominica Severin, MD;  Location: [facility] OR;  Service: Orthopedics;  Laterality: Right;   INCISION AND DRAINAGE INTRA ORAL ABSCESS     OPEN REDUCTION INTERNAL FIXATION (ORIF) METACARPAL Right 07/10/2013   Procedure: OPEN REDUCTION INTERNAL FIXATION (ORIF) METACARPAL RIGHT SMALL FINGER ;  Surgeon: Dominica Severin, MD;  Location: [facility] OR;  Service: Orthopedics;  Laterality: Right;   TUBAL LIGATION     WISDOM TOOTH EXTRACTION       OB History   No obstetric history on file.     Family History  Problem Relation Age of Onset   Cancer Other     Diabetes Other    COPD Mother    Cancer Mother     Social History   Tobacco Use   Smoking status: Some Days    Packs/day: 0.15    Years: 30.00    Pack years: 4.50    Types: Cigarettes   Smokeless tobacco: Never   Tobacco comments:    Still intermittently smoking, was up to half pack a day  Vaping Use   Vaping Use: Never used  Substance Use Topics   Alcohol use: Yes    Comment: on holiday's   Drug use: No    Home Medications Prior to Admission medications   Medication Sig Start Date End Date Taking? Authorizing Provider  Biotin 1000 MCG tablet Take 1,000 mcg by mouth 3 (three) times daily.     [provider]  ciprofloxacin (CIPRO) 500 MG tablet Take 1 tablet (500 mg total) by mouth 2 (two) times daily. One po bid x 7 days 08/23/19   Geoffery Lyons, MD  fish oil-omega-3 fatty acids 1000 MG capsule Take 1 g by mouth daily.     [provider]  HYDROcodone-acetaminophen (NORCO) 5-325 MG tablet Take 1-2 tablets by mouth every 6 (six) hours as needed. 08/23/19   Geoffery Lyons, MD  ibuprofen (ADVIL,MOTRIN) 600 MG tablet Take 1 tablet (600 mg total) by mouth every 6 (six) hours  as needed. Patient not taking: Reported on 04/18/2019 01/15/17   Elson Areas, PA-C  Milk Thistle 1000 MG CAPS Take 2,000 mg by mouth 3 (three) times daily with meals.     [provider]  ondansetron (ZOFRAN ODT) 4 MG disintegrating tablet Take 1 tablet (4 mg total) by mouth every 8 (eight) hours as needed for nausea or vomiting. Patient not taking: Reported on 04/18/2019 12/06/18   Joy, Ines Bloomer C, PA-C  oxyCODONE (OXY IR/ROXICODONE) 5 MG immediate release tablet Take 1 tablet (5 mg total) by mouth every 4 (four) hours as needed for severe pain. Patient not taking: Reported on 04/18/2019 12/06/18   Anselm Pancoast, PA-C  phenazopyridine (PYRIDIUM) 200 MG tablet Take 1 tablet (200 mg total) by mouth 3 (three) times daily. Patient not taking: Reported on 01/15/2017 11/13/16   Jacalyn Lefevre, MD   vitamin B-12 (CYANOCOBALAMIN) 500 MCG tablet Take 500 mcg by mouth 3 (three) times daily.     [provider]  vitamin E 1000 UNIT capsule Take 1,000 Units by mouth 3 (three) times daily.    [provider]    Allergies    Acetaminophen, Other, and Peroxide [hydrogen peroxide]  Review of Systems   Review of Systems  Skin:  Positive for rash.  All other systems reviewed and are negative.  Physical Exam Updated Vital Signs BP 127/82 (BP Location: Right Arm)   Pulse 94   Temp 98.7 F (37.1 C) (Oral)   Resp 20   Ht 5\' 7"  (1.702 m)   Wt 84.4 kg   SpO2 94%   BMI 29.13 kg/m   Physical Exam Vitals and nursing note reviewed.  Constitutional:      Appearance: Normal appearance.  HENT:     Head: Normocephalic and atraumatic.  Eyes:     General:        Right eye: No discharge.        Left eye: No discharge.     Conjunctiva/sclera: Conjunctivae normal.  Pulmonary:     Effort: Pulmonary effort is normal.  Skin:    General: Skin is warm and dry.     Findings: No rash.     Comments: There was one painful lesion to the upper back.  There is no erythematous base or clusters. It is not open or having any drainage.  Neurological:     General: No focal deficit present.     Mental Status: She is alert.  Psychiatric:        Mood and Affect: Mood normal.        Behavior: Behavior normal.    ED Results / Procedures / Treatments   Labs (all labs ordered are listed, but only abnormal results are displayed) Labs Reviewed  RPR  RAPID HIV SCREEN (HIV 1/2 AB+AG)    EKG None  Radiology No results found.  Procedures Procedures   Medications Ordered in ED Medications - No data to display  ED Course  I have reviewed the triage vital signs and the nursing notes.  Pertinent labs & imaging results that were available during my care of the patient were reviewed by me and considered in my medical decision making (see chart for details).    MDM  Rules/Calculators/A&P                          [name] is a 55 y.o. female here for evaluation of possible syphilis.  Appropriate testing was ordered.  I  instructed the patient that these take time to culture and she will be notified if the result is positive.  I also instructed her to keep up the results on MyChart.  If they are positive she will need to come back for appropriate antibiotic therapy.  If she begins having vaginal symptoms I instructed her to either come back to the emergency department or to the health department for gonorrhea chlamydia testing.  Upon leaving the room she also is complaining of nail discoloration and disfiguration.  This is likely onychomycosis.  I instructed that topical antifungals would not really help at this point.  She needs oral antifungals.  She needs to get established with a primary care doctor for further evaluation.  She is safe for discharge.   Final Clinical Impression(s) / ED Diagnoses Final diagnoses:  Skin lesion    Rx / DC Orders ED Discharge Orders     None        Teressa Lower, Cordelia Poche 07/21/21 2126    Sloan Leiter, DO 07/21/21 2343

## 2021-07-21 NOTE — ED Triage Notes (Signed)
Pt suspects syphilis exposure. Admits to bumps and rash on lower legs.

## 2021-07-22 LAB — RPR: RPR Ser Ql: NONREACTIVE

## 2022-11-04 ENCOUNTER — Emergency Department (HOSPITAL_COMMUNITY): Payer: Medicaid Other

## 2022-11-04 ENCOUNTER — Other Ambulatory Visit: Payer: Self-pay

## 2022-11-04 ENCOUNTER — Encounter (HOSPITAL_COMMUNITY): Payer: Self-pay

## 2022-11-04 ENCOUNTER — Emergency Department (HOSPITAL_COMMUNITY)
Admission: EM | Admit: 2022-11-04 | Discharge: 2022-11-04 | Disposition: A | Discharge status: Home / Self Care | Payer: Medicaid Other | Attending: Emergency Medicine | Admitting: Emergency Medicine

## 2022-11-04 DIAGNOSIS — H9311 Tinnitus, right ear: Secondary | ICD-10-CM | POA: Diagnosis not present

## 2022-11-04 DIAGNOSIS — S0990XA Unspecified injury of head, initial encounter: Secondary | ICD-10-CM | POA: Diagnosis present

## 2022-11-04 DIAGNOSIS — S0083XA Contusion of other part of head, initial encounter: Secondary | ICD-10-CM | POA: Diagnosis not present

## 2022-11-04 DIAGNOSIS — S80211A Abrasion, right knee, initial encounter: Secondary | ICD-10-CM | POA: Insufficient documentation

## 2022-11-04 DIAGNOSIS — M7918 Myalgia, other site: Secondary | ICD-10-CM

## 2022-11-04 MED ORDER — IBUPROFEN 400 MG PO TABS
600.0000 mg | ORAL_TABLET | Freq: Once | ORAL | Status: AC
  Administered 2022-11-04: 600 mg via ORAL
  Filled 2022-11-04: qty 1

## 2022-11-04 MED ORDER — OXYCODONE HCL 5 MG PO TABS
5.0000 mg | ORAL_TABLET | Freq: Once | ORAL | Status: AC
  Administered 2022-11-04: 5 mg via ORAL
  Filled 2022-11-04: qty 1

## 2022-11-04 MED ORDER — IBUPROFEN 600 MG PO TABS: 600.0000 mg | ORAL_TABLET | Freq: Four times a day (QID) | ORAL | 0 refills | Status: DC | PRN
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
## 2022-11-04 NOTE — ED Notes (Signed)
Patient ambulatory with steady gait to bathroom and back to bed
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
## 2023-01-30 ENCOUNTER — Encounter (HOSPITAL_COMMUNITY): Payer: Self-pay

## 2023-01-30 ENCOUNTER — Emergency Department (HOSPITAL_COMMUNITY)
Admission: EM | Admit: 2023-01-30 | Discharge: 2023-01-30 | Disposition: A | Discharge status: Home / Self Care | Payer: 59 | Attending: Emergency Medicine | Admitting: Emergency Medicine

## 2023-01-30 ENCOUNTER — Emergency Department (HOSPITAL_COMMUNITY): Payer: 59

## 2023-01-30 ENCOUNTER — Other Ambulatory Visit: Payer: Self-pay

## 2023-01-30 DIAGNOSIS — Y92410 Unspecified street and highway as the place of occurrence of the external cause: Secondary | ICD-10-CM | POA: Diagnosis not present

## 2023-01-30 DIAGNOSIS — S43401A Unspecified sprain of right shoulder joint, initial encounter: Secondary | ICD-10-CM | POA: Insufficient documentation

## 2023-01-30 DIAGNOSIS — M25511 Pain in right shoulder: Secondary | ICD-10-CM | POA: Diagnosis present

## 2023-01-30 MED ORDER — OXYCODONE HCL 5 MG PO TABS
5.0000 mg | ORAL_TABLET | Freq: Once | ORAL | Status: DC
  Filled 2023-01-30: qty 1
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
## 2023-02-15 ENCOUNTER — Emergency Department (HOSPITAL_COMMUNITY)
Admission: EM | Admit: 2023-02-15 | Discharge: 2023-02-15 | Disposition: A | Discharge status: Home / Self Care | Payer: 59 | Attending: Emergency Medicine | Admitting: Emergency Medicine

## 2023-02-15 ENCOUNTER — Encounter (HOSPITAL_COMMUNITY): Payer: Self-pay

## 2023-02-15 ENCOUNTER — Emergency Department (HOSPITAL_COMMUNITY): Payer: 59

## 2023-02-15 ENCOUNTER — Other Ambulatory Visit: Payer: Self-pay

## 2023-02-15 DIAGNOSIS — N3091 Cystitis, unspecified with hematuria: Secondary | ICD-10-CM | POA: Insufficient documentation

## 2023-02-15 DIAGNOSIS — R300 Dysuria: Secondary | ICD-10-CM | POA: Diagnosis present

## 2023-02-15 DIAGNOSIS — N3090 Cystitis, unspecified without hematuria: Secondary | ICD-10-CM

## 2023-02-15 LAB — URINALYSIS, W/ REFLEX TO CULTURE (INFECTION SUSPECTED)
Bilirubin Urine: NEGATIVE
Glucose, UA: NEGATIVE mg/dL
Ketones, ur: NEGATIVE mg/dL
Nitrite: NEGATIVE
Protein, ur: 30 mg/dL — AB
Specific Gravity, Urine: 1.011 (ref 1.005–1.030)
WBC, UA: >50 WBC/hpf (ref 0–5)
pH: 6 (ref 5.0–8.0)

## 2023-02-15 LAB — CBC
HCT: 42.2 % (ref 36.0–46.0)
Hemoglobin: 14 g/dL (ref 12.0–15.0)
MCH: 28.7 pg (ref 26.0–34.0)
MCHC: 33.2 g/dL (ref 30.0–36.0)
MCV: 86.5 fL (ref 80.0–100.0)
Platelets: 347 10*3/uL (ref 150–400)
RBC: 4.88 MIL/uL (ref 3.87–5.11)
RDW: 13.2 % (ref 11.5–15.5)
WBC: 8.1 10*3/uL (ref 4.0–10.5)
nRBC: 0 % (ref 0.0–0.2)

## 2023-02-15 LAB — COMPREHENSIVE METABOLIC PANEL
ALT: 21 U/L (ref 0–44)
AST: 21 U/L (ref 15–41)
Albumin: 4.3 g/dL (ref 3.5–5.0)
Alkaline Phosphatase: 57 U/L (ref 38–126)
Anion gap: 9 (ref 5–15)
BUN: 15 mg/dL (ref 6–20)
CO2: 21 mmol/L — ABNORMAL LOW (ref 22–32)
Calcium: 9.3 mg/dL (ref 8.9–10.3)
Chloride: 110 mmol/L (ref 98–111)
Creatinine, Ser: 0.84 mg/dL (ref 0.44–1.00)
GFR, Estimated: >60 mL/min (ref >60)
Glucose, Bld: 103 mg/dL — ABNORMAL HIGH (ref 70–99)
Potassium: 4.1 mmol/L (ref 3.5–5.1)
Sodium: 140 mmol/L (ref 135–145)
Total Bilirubin: 0.6 mg/dL (ref 0.3–1.2)
Total Protein: 8.3 g/dL — ABNORMAL HIGH (ref 6.5–8.1)

## 2023-02-15 LAB — I-STAT BETA HCG BLOOD, ED (MC, WL, AP ONLY): I-stat hCG, quantitative: <5 m[IU]/mL (ref <5)

## 2023-02-15 MED ORDER — IOHEXOL 300 MG/ML  SOLN
100.0000 mL | Freq: Once | INTRAMUSCULAR | Status: AC | PRN
  Administered 2023-02-15: 100 mL via INTRAVENOUS

## 2023-02-15 MED ORDER — CEFADROXIL 500 MG PO CAPS: 500.0000 mg | ORAL_CAPSULE | Freq: Two times a day (BID) | ORAL | 0 refills | Status: AC

## 2023-02-15 MED ORDER — SODIUM CHLORIDE 0.9 % IV SOLN
1.0000 g | Freq: Once | INTRAVENOUS | Status: AC
  Administered 2023-02-15: 1 g via INTRAVENOUS
  Filled 2023-02-15: qty 10

## 2023-02-15 MED ORDER — MORPHINE SULFATE (PF) 4 MG/ML IV SOLN
4.0000 mg | Freq: Once | INTRAVENOUS | Status: AC
  Administered 2023-02-15: 4 mg via INTRAVENOUS
  Filled 2023-02-15: qty 1

## 2023-02-15 MED ORDER — ONDANSETRON HCL 4 MG/2ML IJ SOLN
4.0000 mg | Freq: Once | INTRAMUSCULAR | Status: AC
  Administered 2023-02-15: 4 mg via INTRAVENOUS
  Filled 2023-02-15: qty 2

## 2023-02-15 MED ORDER — IBUPROFEN 600 MG PO TABS: 600.0000 mg | ORAL_TABLET | Freq: Four times a day (QID) | ORAL | 0 refills | Status: AC | PRN

## 2023-02-15 MED ORDER — TIZANIDINE HCL 4 MG PO TABS: 4.0000 mg | ORAL_TABLET | Freq: Four times a day (QID) | ORAL | 0 refills | Status: AC | PRN

## 2023-02-15 MED ORDER — KETOROLAC TROMETHAMINE 15 MG/ML IJ SOLN
15.0000 mg | Freq: Once | INTRAMUSCULAR | Status: AC
  Administered 2023-02-15: 15 mg via INTRAVENOUS
  Filled 2023-02-15: qty 1

## 2023-02-15 MED ORDER — SODIUM CHLORIDE 0.9 % IV BOLUS
500.0000 mL | Freq: Once | INTRAVENOUS | Status: AC
  Administered 2023-02-15: 500 mL via INTRAVENOUS

## 2023-02-15 NOTE — ED Triage Notes (Signed)
Pt arrived via POV. C/o flank pain, dysuria for 10x days. Pt now c/o urinary rentention and blood in urine.  AOx4

## 2023-02-15 NOTE — ED Notes (Signed)
Patient transported to CT 

## 2023-02-15 NOTE — ED Provider Notes (Signed)
[place] EMERGENCY DEPARTMENT AT [facility] Provider Note   CSN: [id number] Arrival date & time: 02/15/23  1242     History  Chief Complaint  Patient presents with   Flank Pain   Hematuria    [name] is a 57 y.o. female.   Flank Pain  Hematuria  57 year old female presents emergency department with complaints of dysuria, flank pain, hematuria, urinary retention.  Patient states that symptoms began approximately 10 days ago with feelings of burning while peeing.  She states that symptoms have persisted since onset.  States that she developed bilateral flank pain over the past 2 to 3 days.  Reports subjective fever at home.  Reports feelings of nausea with no bouts of emesis.  States that today, noticed some feelings of urinary retention as well as blood in urine.  States that she has been eating and drinking minimally secondary to not wanting to urinate because of the pain.  Denies saddle anesthesia, bowel dysfunction, weakness or sensory deficits in lower extremities, saddle anesthesia, history of IV drug use  Past medical history significant for liver failure, ESBL e.coli, hepatitis B  Home Medications Prior to Admission medications   Medication Sig Start Date End Date Taking? Authorizing Provider  cefadroxil (DURICEF) 500 MG capsule Take 1 capsule (500 mg total) by mouth 2 (two) times daily. 02/15/23  Yes Sherian Maroon A, PA  ibuprofen (ADVIL) 600 MG tablet Take 1 tablet (600 mg total) by mouth every 6 (six) hours as needed. 02/15/23  Yes Sherian Maroon A, PA  tiZANidine (ZANAFLEX) 4 MG tablet Take 1 tablet (4 mg total) by mouth every 6 (six) hours as needed for muscle spasms. 02/15/23  Yes Sherian Maroon A, PA  Biotin 1000 MCG tablet Take 1,000 mcg by mouth 3 (three) times daily.     [provider]  fish oil-omega-3 fatty acids 1000 MG capsule Take 1 g by mouth daily.     [provider]  Milk Thistle 1000 MG CAPS Take 2,000 mg by mouth 3  (three) times daily with meals.     [provider]  vitamin B-12 (CYANOCOBALAMIN) 500 MCG tablet Take 500 mcg by mouth 3 (three) times daily.     [provider]  vitamin E 1000 UNIT capsule Take 1,000 Units by mouth 3 (three) times daily.    [provider]      Allergies    Acetaminophen, Other, and Peroxide [hydrogen peroxide]    Review of Systems   Review of Systems  Genitourinary:  Positive for flank pain and hematuria.  All other systems reviewed and are negative.   Physical Exam Updated Vital Signs BP (!) 153/96   Pulse 82   Temp 98.7 F (37.1 C) (Oral)   Resp 18   Ht 5\' 7"  (1.702 m)   Wt 82.1 kg   SpO2 97%   BMI 28.35 kg/m  Physical Exam Vitals and nursing note reviewed.  Constitutional:      General: She is not in acute distress.    Appearance: She is well-developed. She is ill-appearing.     Comments: Patient tearful with complaints of bilateral back pain.   HENT:     Head: Normocephalic and atraumatic.  Eyes:     Conjunctiva/sclera: Conjunctivae normal.  Cardiovascular:     Rate and Rhythm: Normal rate and regular rhythm.     Heart sounds: No murmur heard. Pulmonary:     Effort: Pulmonary effort is normal. No respiratory distress.  Breath sounds: Normal breath sounds.  Abdominal:     Palpations: Abdomen is soft.     Tenderness: There is no abdominal tenderness. There is right CVA tenderness and left CVA tenderness.  Musculoskeletal:        General: No swelling.     Cervical back: Neck supple.     Right lower leg: No edema.     Left lower leg: No edema.     Comments: Midline tenderness of lumbar spine.  Muscular strength 5 out of 5 for lower extremities.  No sensory deficits along major nerve distributions of lower extremities.  Pedal pulses 2+ bilaterally.  Skin:    General: Skin is warm and dry.     Capillary Refill: Capillary refill takes less than 2 seconds.  Neurological:     Mental Status: She is alert.   Psychiatric:        Mood and Affect: Mood normal.    ED Results / Procedures / Treatments   Labs (all labs ordered are listed, but only abnormal results are displayed) Labs Reviewed  URINALYSIS, W/ REFLEX TO CULTURE (INFECTION SUSPECTED) - Abnormal; Notable for the following components:      Result Value   APPearance CLOUDY (*)    Hgb urine dipstick SMALL (*)    Protein, ur 30 (*)    Leukocytes,Ua LARGE (*)    Bacteria, UA FEW (*)    Non Squamous Epithelial 6-10 (*)    All other components within normal limits  COMPREHENSIVE METABOLIC PANEL - Abnormal; Notable for the following components:   CO2 21 (*)    Glucose, Bld 103 (*)    Total Protein 8.3 (*)    All other components within normal limits  URINE CULTURE  CBC  I-STAT BETA HCG BLOOD, ED ([facility], [facility], [facility] ONLY)    EKG None  Radiology CT ABDOMEN PELVIS W CONTRAST  Result Date: 02/15/2023 CLINICAL DATA:  Flank pain for 10 days.  Dysuria.  Hematuria. EXAM: CT ABDOMEN AND PELVIS WITH CONTRAST TECHNIQUE: Multidetector CT imaging of the abdomen and pelvis was performed using the standard protocol following bolus administration of intravenous contrast. RADIATION DOSE REDUCTION: This exam was performed according to the departmental dose-optimization program which includes automated exposure control, adjustment of the mA and/or kV according to patient size and/or use of iterative reconstruction technique. CONTRAST:  OMNIPAQUE IOHEXOL 300 MG/ML  SOLN COMPARISON:  Noncontrast CT on 12/06/2018 FINDINGS: Lower Chest: No acute findings. Hepatobiliary: No hepatic masses identified. Gallbladder is unremarkable. No evidence of biliary ductal dilatation. Pancreas:  No mass or inflammatory changes. Spleen: Within normal limits in size and appearance. Adrenals/Urinary Tract: No suspicious masses identified. Small bilateral benign appearing renal cysts are noted (No followup imaging is recommended). Mild bilateral renal parenchymal scarring also  seen. No evidence of ureteral calculi or hydronephrosis. Stomach/Bowel: No evidence of obstruction, inflammatory process or abnormal fluid collections. Normal appendix visualized. Diverticulosis is seen mainly involving the sigmoid colon, however there is no evidence of diverticulitis. Vascular/Lymphatic: No pathologically enlarged lymph nodes. No acute vascular findings. Reproductive:  No mass or other significant abnormality. Other:  None. Musculoskeletal:  No suspicious bone lesions identified. IMPRESSION: No acute findings. Mild bilateral renal parenchymal scarring. No evidence of urolithiasis or hydronephrosis. Colonic diverticulosis, without radiographic evidence of diverticulitis. Electronically Signed   By: Danae Orleans M.D.   On: 02/15/2023 15:34    Procedures Procedures    Medications Ordered in ED Medications  ondansetron (ZOFRAN) injection 4 mg (4 mg Intravenous Given 02/15/23  1328)  morphine (PF) 4 MG/ML injection 4 mg (4 mg Intravenous Given 02/15/23 1329)  morphine (PF) 4 MG/ML injection 4 mg (4 mg Intravenous Given 02/15/23 1402)  ketorolac (TORADOL) 15 MG/ML injection 15 mg (15 mg Intravenous Given 02/15/23 1402)  cefTRIAXone (ROCEPHIN) 1 g in sodium chloride 0.9 % 100 mL IVPB (0 g Intravenous Stopped 02/15/23 1455)  sodium chloride 0.9 % bolus 500 mL (0 mLs Intravenous Stopped 02/15/23 1552)  iohexol (OMNIPAQUE) 300 MG/ML solution 100 mL (100 mLs Intravenous Contrast Given 02/15/23 1453)    ED Course/ Medical Decision Making/ A&P                             Medical Decision Making Amount and/or Complexity of Data Reviewed Labs: ordered. Radiology: ordered.  Risk Prescription drug management.   This patient presents to the ED for concern of dysuria, flank pain, this involves an extensive number of treatment options, and is a complaint that carries with it a high risk of complications and morbidity.  The differential diagnosis includes plantar fasciitis, nephrolithiasis,  fracture, dislocation, strain/sprain, cystitis, sepsis, diverticulitis, appendicitis, volvulus, ovarian torsion, HSV,   Co morbidities that complicate the patient evaluation  See HPI   Additional history obtained:  Additional history obtained from EMR External records from outside source obtained and reviewed including hospital records   Lab Tests:  I Ordered, and personally interpreted labs.  The pertinent results include: No leukocytosis noted.  No evidence anemia.  Platelets within range.  Mild decrease in bicarb of 21 but otherwise, electrolytes within normal limits.  No transaminitis.  No renal dysfunction.  UA significant for few bacteria, WBC clumps, large leukocytes.  UA also significant for small hemoglobin, 30 protein and 6-10 non-squamous epithelial cells.   Imaging Studies ordered:  I ordered imaging studies including abdomen pelvis I independently visualized and interpreted imaging which showed no acute findings.  Mild bilateral renal parenchymal scarring.  Colonic diverticulosis. I agree with the radiologist interpretation  Cardiac Monitoring: / EKG:  The patient was maintained on a cardiac monitor.  I personally viewed and interpreted the cardiac monitored which showed an underlying rhythm of: Sinus rhythm   Consultations Obtained:  N/a   Problem List / ED Course / Critical interventions / Medication management  Dysuria, flank pain I ordered medication including Rocephin, 500 cc normal saline, morphine, Zofran, Toradol  Reevaluation of the patient after these medicines showed that the patient improved I have reviewed the patients home medicines and have made adjustments as needed   Social Determinants of Health:  Sensitivities.  Denies illicit drug use.   Test / Admission - Considered:  Dysuria, flank pain Vitals signs significant for hypertension with blood pressure 153/96. Otherwise within normal range and stable throughout  visit. Laboratory/imaging studies significant for: See above 57 year old female presents emergency department with several day history of dysuria as well as development of bilateral flank pain.  Patient with evidence today of urinary tract infection without appreciable inflammatory changes of kidney or evidence of obstructing kidney stone.  Patient noted improvement of symptoms with administrations of medicines while emergency department.  Given appearance of urine, duration of patient's symptoms as well as CVA tenderness, will treat with extended course of antibiotics for treatment of pyelonephritis.  Patient does not meet SIRS criteria.  Patient recommended follow-up with primary care for reassessment of symptoms.  Urine culture pending at this time.  Treatment plan discussed at length with patient and she  acknowledged understanding was agreeable to said plan. Worrisome signs and symptoms were discussed with the patient, and the patient acknowledged understanding to return to the ED if noticed. Patient was stable upon discharge.          Final Clinical Impression(s) / ED Diagnoses Final diagnoses:  Cystitis    Rx / DC Orders ED Discharge Orders          Ordered    cefadroxil (DURICEF) 500 MG capsule  2 times daily        02/15/23 1605         ibuprofen (ADVIL) 600 MG tablet  Every 6 hours PRN        02/15/23 1605              Peter Garter, [place] 02/15/23 2157    Glyn Ade, MD 02/15/23 2252

## 2023-02-15 NOTE — Discharge Instructions (Addendum)
Note the workup today was overall reassuring.  As discussed, urine did show signs of infection.  Will treat with antibiotics.  Regarding back pain, we will send in muscle relaxer to use as needed.  If this medication can cause drowsiness is please be careful with taking this medication.  Recommend follow-up with primary care for reassessment of your symptoms.  Please do not hesitate to return to emergency department for worrisome signs and symptoms we discussed become apparent.

## 2023-02-17 ENCOUNTER — Encounter: Payer: Medicaid Other | Admitting: Medical

## 2023-02-17 LAB — URINE CULTURE: Culture: >=100000 — AB

## 2023-02-18 ENCOUNTER — Telehealth (HOSPITAL_BASED_OUTPATIENT_CLINIC_OR_DEPARTMENT_OTHER): Payer: Self-pay

## 2023-02-18 NOTE — Telephone Encounter (Signed)
Post ED Visit - Positive Culture Follow-up  Culture report reviewed by antimicrobial stewardship pharmacist: [facility] []  Enzo Bi, Pharm.D. []  Celedonio Miyamoto, [address].D., BCPS AQ-ID []  Garvin Fila, Pharm.D., BCPS []  Georgina Pillion, Pharm.D., BCPS []  Andrew, [address].D., BCPS, AAHIVP []  Estella Husk, Pharm.D., BCPS, AAHIVP []  Lysle Pearl, PharmD, BCPS []  Phillips Climes, PharmD, BCPS []  Agapito Games, PharmD, BCPS []  Verlan Friends, PharmD []  Mervyn Gay, PharmD, BCPS []  Vinnie Level, PharmD  [facility] []  Len Childs, PharmD []  Greer Pickerel, PharmD []  Adalberto Cole, PharmD []  Perlie Gold, Rph []  Lonell Face) Jean Rosenthal, PharmD []  Earl Many, PharmD []  Junita Push, PharmD []  Dorna Leitz, PharmD []  Terrilee Files, PharmD []  Lynann Beaver, PharmD []  Keturah Barre, PharmD []  Loralee Pacas, PharmD []  Bernadene Person, PharmD X   Georgina Pillion, Pharm.D., BCPS  Positive urine culture >/= 100,000 colonies -> E Coli Treated with Cefadroxil, organism sensitive to the same and no further patient follow-up is required at this time.  Arvid Right 02/18/2023, 9:41 AM
